# Patient Record
Sex: Female | Born: 1970
Health system: Southern US, Community
[De-identification: ages and names within clinical notes are randomized; demographics above are authoritative.]

## PROBLEM LIST (undated history)

## (undated) DIAGNOSIS — Z87440 Personal history of urinary (tract) infections: Secondary | ICD-10-CM

## (undated) DIAGNOSIS — B019 Varicella without complication: Secondary | ICD-10-CM

## (undated) DIAGNOSIS — M1990 Unspecified osteoarthritis, unspecified site: Secondary | ICD-10-CM

## (undated) DIAGNOSIS — E039 Hypothyroidism, unspecified: Secondary | ICD-10-CM

## (undated) DIAGNOSIS — T7840XA Allergy, unspecified, initial encounter: Secondary | ICD-10-CM

## (undated) HISTORY — DX: Allergy, unspecified, initial encounter: T78.40XA

## (undated) HISTORY — PX: NO PAST SURGERIES: SHX2092

## (undated) HISTORY — DX: Unspecified osteoarthritis, unspecified site: M19.90

## (undated) HISTORY — DX: Hypothyroidism, unspecified: E03.9

## (undated) HISTORY — DX: Varicella without complication: B01.9

## (undated) HISTORY — DX: Personal history of urinary (tract) infections: Z87.440

---

## 1998-01-30 ENCOUNTER — Other Ambulatory Visit: Admission: RE | Admit: 1998-01-30 | Discharge: 1998-01-30 | Payer: Self-pay | Admitting: *Deleted

## 1999-03-02 ENCOUNTER — Other Ambulatory Visit: Admission: RE | Admit: 1999-03-02 | Discharge: 1999-03-02 | Payer: Self-pay | Admitting: Obstetrics and Gynecology

## 1999-09-19 ENCOUNTER — Inpatient Hospital Stay (HOSPITAL_COMMUNITY): Admission: AD | Admit: 1999-09-19 | Discharge: 1999-09-21 | Payer: Self-pay | Admitting: Obstetrics and Gynecology

## 1999-11-02 ENCOUNTER — Other Ambulatory Visit: Admission: RE | Admit: 1999-11-02 | Discharge: 1999-11-02 | Payer: Self-pay | Admitting: Obstetrics and Gynecology

## 2000-12-07 ENCOUNTER — Other Ambulatory Visit: Admission: RE | Admit: 2000-12-07 | Discharge: 2000-12-07 | Payer: Self-pay | Admitting: Obstetrics and Gynecology

## 2001-10-15 ENCOUNTER — Other Ambulatory Visit: Admission: RE | Admit: 2001-10-15 | Discharge: 2001-10-15 | Payer: Self-pay | Admitting: Obstetrics and Gynecology

## 2002-05-06 ENCOUNTER — Inpatient Hospital Stay (HOSPITAL_COMMUNITY): Admission: AD | Admit: 2002-05-06 | Discharge: 2002-05-08 | Payer: Self-pay | Admitting: Obstetrics and Gynecology

## 2002-06-04 ENCOUNTER — Other Ambulatory Visit: Admission: RE | Admit: 2002-06-04 | Discharge: 2002-06-04 | Payer: Self-pay | Admitting: Obstetrics and Gynecology

## 2003-05-16 ENCOUNTER — Emergency Department (HOSPITAL_COMMUNITY): Admission: EM | Admit: 2003-05-16 | Discharge: 2003-05-16 | Payer: Self-pay | Admitting: Emergency Medicine

## 2003-05-28 ENCOUNTER — Emergency Department (HOSPITAL_COMMUNITY): Admission: EM | Admit: 2003-05-28 | Discharge: 2003-05-28 | Payer: Self-pay | Admitting: Emergency Medicine

## 2003-06-11 ENCOUNTER — Other Ambulatory Visit: Admission: RE | Admit: 2003-06-11 | Discharge: 2003-06-11 | Payer: Self-pay | Admitting: Obstetrics and Gynecology

## 2004-07-14 ENCOUNTER — Other Ambulatory Visit: Admission: RE | Admit: 2004-07-14 | Discharge: 2004-07-14 | Payer: Self-pay | Admitting: Obstetrics and Gynecology

## 2011-07-18 ENCOUNTER — Ambulatory Visit
Admission: RE | Admit: 2011-07-18 | Discharge: 2011-07-18 | Disposition: A | Discharge status: Home / Self Care | Payer: BC Managed Care – PPO | Source: Ambulatory Visit | Attending: Family Medicine | Admitting: Family Medicine

## 2011-07-18 ENCOUNTER — Other Ambulatory Visit: Payer: Self-pay | Admitting: Family Medicine

## 2011-07-18 DIAGNOSIS — M545 Low back pain, unspecified: Secondary | ICD-10-CM

## 2011-08-08 ENCOUNTER — Ambulatory Visit: Payer: BC Managed Care – PPO | Attending: Family Medicine

## 2011-08-08 DIAGNOSIS — IMO0001 Reserved for inherently not codable concepts without codable children: Secondary | ICD-10-CM | POA: Insufficient documentation

## 2011-08-08 DIAGNOSIS — M25659 Stiffness of unspecified hip, not elsewhere classified: Secondary | ICD-10-CM | POA: Insufficient documentation

## 2011-08-08 DIAGNOSIS — M545 Low back pain, unspecified: Secondary | ICD-10-CM | POA: Insufficient documentation

## 2011-08-12 ENCOUNTER — Ambulatory Visit: Payer: BC Managed Care – PPO | Admitting: Physical Therapy

## 2011-08-19 ENCOUNTER — Ambulatory Visit: Payer: BC Managed Care – PPO

## 2011-08-23 ENCOUNTER — Ambulatory Visit: Payer: BC Managed Care – PPO

## 2011-08-25 ENCOUNTER — Ambulatory Visit: Payer: BC Managed Care – PPO | Admitting: Physical Therapy

## 2011-08-29 ENCOUNTER — Ambulatory Visit: Payer: BC Managed Care – PPO | Attending: Family Medicine

## 2011-08-29 DIAGNOSIS — M25659 Stiffness of unspecified hip, not elsewhere classified: Secondary | ICD-10-CM | POA: Insufficient documentation

## 2011-08-29 DIAGNOSIS — M545 Low back pain, unspecified: Secondary | ICD-10-CM | POA: Insufficient documentation

## 2011-08-29 DIAGNOSIS — IMO0001 Reserved for inherently not codable concepts without codable children: Secondary | ICD-10-CM | POA: Insufficient documentation

## 2011-09-20 ENCOUNTER — Other Ambulatory Visit: Payer: Self-pay | Admitting: Family Medicine

## 2011-09-20 DIAGNOSIS — G8929 Other chronic pain: Secondary | ICD-10-CM

## 2011-09-20 DIAGNOSIS — M549 Dorsalgia, unspecified: Secondary | ICD-10-CM

## 2011-09-24 ENCOUNTER — Ambulatory Visit
Admission: RE | Admit: 2011-09-24 | Discharge: 2011-09-24 | Disposition: A | Discharge status: Home / Self Care | Payer: BC Managed Care – PPO | Source: Ambulatory Visit | Attending: Family Medicine | Admitting: Family Medicine

## 2011-09-24 DIAGNOSIS — G8929 Other chronic pain: Secondary | ICD-10-CM

## 2011-11-01 ENCOUNTER — Ambulatory Visit: Payer: BC Managed Care – PPO | Attending: Orthopedic Surgery

## 2011-11-01 DIAGNOSIS — M545 Low back pain, unspecified: Secondary | ICD-10-CM | POA: Insufficient documentation

## 2011-11-01 DIAGNOSIS — IMO0001 Reserved for inherently not codable concepts without codable children: Secondary | ICD-10-CM | POA: Insufficient documentation

## 2011-11-01 DIAGNOSIS — M546 Pain in thoracic spine: Secondary | ICD-10-CM | POA: Insufficient documentation

## 2011-11-01 DIAGNOSIS — R5381 Other malaise: Secondary | ICD-10-CM | POA: Insufficient documentation

## 2011-11-07 ENCOUNTER — Ambulatory Visit: Payer: BC Managed Care – PPO | Admitting: Physical Therapy

## 2011-11-09 ENCOUNTER — Ambulatory Visit: Payer: BC Managed Care – PPO | Admitting: Physical Therapy

## 2011-11-14 ENCOUNTER — Encounter: Payer: BC Managed Care – PPO | Admitting: Physical Therapy

## 2013-10-08 ENCOUNTER — Encounter: Payer: Self-pay | Admitting: *Deleted

## 2013-10-14 ENCOUNTER — Ambulatory Visit (INDEPENDENT_AMBULATORY_CARE_PROVIDER_SITE_OTHER): Payer: BC Managed Care – PPO | Admitting: Family Medicine

## 2013-10-14 ENCOUNTER — Encounter: Payer: Self-pay | Admitting: Family Medicine

## 2013-10-14 VITALS — BP 108/82 | HR 75 | Temp 97.5°F | Wt 125.0 lb

## 2013-10-14 DIAGNOSIS — J309 Allergic rhinitis, unspecified: Secondary | ICD-10-CM

## 2013-10-14 DIAGNOSIS — J302 Other seasonal allergic rhinitis: Secondary | ICD-10-CM

## 2013-10-14 DIAGNOSIS — Z7689 Persons encountering health services in other specified circumstances: Secondary | ICD-10-CM

## 2013-10-14 DIAGNOSIS — R946 Abnormal results of thyroid function studies: Secondary | ICD-10-CM

## 2013-10-14 DIAGNOSIS — Z7189 Other specified counseling: Secondary | ICD-10-CM

## 2013-10-14 NOTE — Progress Notes (Signed)
Pre visit review using our clinic review tool, if applicable. No additional management support is needed unless otherwise documented below in the visit note. 

## 2013-10-14 NOTE — Progress Notes (Signed)
No chief complaint on file.   HPI:  Melissa Johnson is here to establish care. Was unhappy with her prior pcp. She reports she is very sensitive to medications. Last PCP and physical:  Has the following chronic problems and concerns today:  There are no active problems to display for this patient.  Hypothyroidism: -diagnosed last year -reports she was put on synthroid and felt like did not tolerate this at all -reports the synthroid caused bloating and nausea for 1.5 weeks, hair loss worse rather then better on synthroid, acne -reports she stopped the synthroid in feb 2015 -feels better of the synthroid and really does not want to take it -denies: weight changes,  palpitations -she does have issues with constipation on and off -after being off of synthroid for 8 weeks she brings TSH which is 5/70 and free t4 at 0.55 on 09/16/13  Tongue swelling: -intermittently -she has read a lot that this is related to thyroid disorders  Chronic joint pain: -for several years -now gone after going gluten free for the last few months -does not exercise on a regular basis  Allergic Rhinitis: -used to get allergy shots -takes allegra and doing well  Tdap: she thinks she is UTD on vaccines   ROS negative for unless reported above: fevers, unintentional weight loss, hearing or vision loss, chest pain, palpitations, struggling to breath, hemoptysis, melena, hematochezia, hematuria, falls, loc, si, thoughts of self harm  Past Medical History  Diagnosis Date  . Hypothyroidism   . Chicken pox   . History of UTI   . Allergy   . Arthritis     OA, DDD    Family History  Problem Relation Age of Onset  . Heart disease Father     CAD  . Heart disease Paternal Grandmother     History   Social History  . Marital Status: Married    Spouse Name: N/A    Number of Children: N/A  . Years of Education: N/A   Social History Main Topics  . Smoking status: Former Research scientist (life sciences)  . Smokeless tobacco:  None     Comment: only in highschool and college  . Alcohol Use: Yes     Comment: 1 glass of wine here and there  . Drug Use: No  . Sexual Activity: None   Other Topics Concern  . None   Social History Narrative   Work or School: Teaches preschool - part-time      Home Situation: lives with husband and two kids (34 and 21 - 2015)      Spiritual Beliefs: Christian      Lifestyle: no regular exercise; diet is healthy and gluten free             Current outpatient prescriptions:fexofenadine (ALLEGRA) 180 MG tablet, Take 180 mg by mouth daily., Disp: , Rfl:   EXAM:  Filed Vitals:   10/14/13 1433  BP: 108/82  Pulse: 75  Temp: 97.5 F (36.4 C)    There is no height on file to calculate BMI.  GENERAL: vitals reviewed and listed above, alert, oriented, appears well hydrated and in no acute distress  HEENT: atraumatic, conjunttiva clear, no obvious abnormalities on inspection of external nose and ears  NECK: no obvious masses on inspection  LUNGS: clear to auscultation bilaterally, no wheezes, rales or rhonchi, good air movement  CV: HRRR, no peripheral edema  MS: moves all extremities without noticeable abnormality  PSYCH: pleasant and cooperative, no obvious depression or anxiety  ASSESSMENT  AND PLAN:  Discussed the following assessment and plan:  Borderline abnormal TFTs - Plan: Ambulatory referral to Endocrinology  Encounter to establish care  Seasonal allergies  -We reviewed the PMH, PSH, FH, SH, Meds and Allergies. -We provided refills for any medications we will prescribe as needed. -We addressed current concerns per orders and patient instructions. -We have asked for records for pertinent exams, studies, vaccines and notes from previous providers. -We have advised patient to follow up per instructions below.   -Patient advised to return or notify a doctor immediately if symptoms worsen or persist or new concerns arise.  Patient Instructions   -Vitamin D3 1000 IU daily ([facility] brand)  -stop the krill oil and multivitamin for now   We recommend the following healthy lifestyle measures: - eat a healthy diet consisting of lots of vegetables, fruits, beans, nuts, seeds, healthy meats such as white chicken and fish and whole grains.  - avoid fried foods, fast food, processed foods, sodas, red meet and other fattening foods.  - get a least 150 minutes of aerobic exercise per week.   Follow up in: 3 months      Abrham Maslowski R.

## 2013-10-14 NOTE — Patient Instructions (Signed)
-  Vitamin D3 1000 IU daily ([facility] brand)  -stop the krill oil and multivitamin for now   We recommend the following healthy lifestyle measures: - eat a healthy diet consisting of lots of vegetables, fruits, beans, nuts, seeds, healthy meats such as white chicken and fish and whole grains.  - avoid fried foods, fast food, processed foods, sodas, red meet and other fattening foods.  - get a least 150 minutes of aerobic exercise per week.   Follow up in: 3 months

## 2013-10-29 ENCOUNTER — Encounter: Payer: Self-pay | Admitting: Internal Medicine

## 2013-10-29 ENCOUNTER — Ambulatory Visit (INDEPENDENT_AMBULATORY_CARE_PROVIDER_SITE_OTHER): Payer: BC Managed Care – PPO | Admitting: Internal Medicine

## 2013-10-29 VITALS — BP 100/68 | HR 83 | Temp 98.2°F | Resp 12 | Ht 62.25 in | Wt 122.6 lb

## 2013-10-29 DIAGNOSIS — E039 Hypothyroidism, unspecified: Secondary | ICD-10-CM | POA: Insufficient documentation

## 2013-10-29 NOTE — Patient Instructions (Signed)
Please stop at the lab. Please try to join MyChart for easier communication. Please come back for a follow-up appointment in 3 months.

## 2013-10-29 NOTE — Progress Notes (Signed)
Patient ID: Melissa Johnson, female   DOB: 02-15-1971, 43 y.o.   MRN: [id number]   HPI  Melissa Johnson is a 43 y.o.-year-old female, referred by her PCP, Dr. Maudie Mercury, in consultation for ? Need for thyroid hormone supplementation (previously had intolerance) in in the setting of mild hypothyroidism.  Pt. has been dx with hypothyroidism in 11/2012 (had hair loss and brittle nails); she started Synthroid DAW then >> felt worse (more hair loss, fatigue) and could not tolerate it: bloating, nausea >> stopped 04/2013. She would not want to restart it. 8 weeks after she stopped the LT4 >> labs: 09/16/2013: TSH 5.7 (0.34-4.5), free t4 0.55 (0.61-1.12)  I reviewed pt's previous thyroid tests: 04/29/2013: TSH 1.83, free t4 0.65 01/18/2013: TSH 1.46 11/2012: TSH 6  Pt describes no : - resolved fatigue - no weight gain or loss - no cold intolerance - no constipation - no dry skin - resolved hair falling - no depression  Pt denies feeling nodules in neck, hoarseness, dysphagia/odynophagia, SOB with lying down.  She has + FH of thyroid disorders in: sister (hypothyroid), mother (hyperthyroidism) - none on meds. No FH of thyroid cancer.  No h/o radiation tx to head or neck. No use of iodine supplements.  ROS: Constitutional: see HPI Eyes: no blurry vision, no xerophthalmia ENT: no sore throat, no nodules palpated in throat, no dysphagia/odynophagia, no hoarseness Cardiovascular: no CP/SOB/palpitations/leg swelling Respiratory: no cough/SOB Gastrointestinal: no N/V/D/C Musculoskeletal: + muscle/+ joint aches Skin: no rashes Neurological: no tremors/numbness/tingling/dizziness Psychiatric: no depression/+ anxiety  Past Medical History  Diagnosis Date  . Hypothyroidism   . Chicken pox   . History of UTI   . Allergy   . Arthritis     OA, DDD   No past surgical history on file.  History   Social History Main Topics  . Smoking status: Former Research scientist (life sciences)  . Smokeless tobacco: Not on file      Comment: only in highschool and college  . Alcohol Use: Yes     Comment: 1 glass of wine once-twice a week  . 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 fexofenadine (ALLEGRA) 180 MG tablet Take 180 mg by mouth daily.       No current facility-administered medications on file prior to visit.   No Known Allergies Family History  Problem Relation Age of Onset  . Heart disease Father     CAD  . Heart disease Paternal Grandmother    PE: BP 100/68  Pulse 83  Temp(Src) 98.2 F (36.8 C) (Oral)  Resp 12  Ht 5' 2.25" (1.581 m)  Wt 122 lb 9.6 oz (55.611 kg)  BMI 22.25 kg/m2  SpO2 98% Wt Readings from Last 3 Encounters:  10/29/13 122 lb 9.6 oz (55.611 kg)  10/14/13 125 lb (56.7 kg)   Constitutional: normal weight, in NAD Eyes: PERRLA, EOMI, no exophthalmos ENT: moist mucous membranes, no thyromegaly, no cervical lymphadenopathy Cardiovascular: RRR, No MRG Respiratory: CTA B Gastrointestinal: abdomen soft, NT, ND, BS+ Musculoskeletal: no deformities, strength intact in all 4 Skin: moist, warm, no rashes Neurological: no tremor with outstretched hands,  DTR normal in all 4  ASSESSMENT: 1. Hypothyroidism - mild  PLAN:  1. Patient with relatively new dx of hypothyroidism, not on levothyroxine therapy. She appears euthyroid. She does not appear to have a goiter, thyroid nodules, or neck compression symptoms. - she tried Synthroid DAW and could not tolerate it - would like to know about alternatives. We discussed that her hypothyroidism is mild, and the fact that we can continue to observe it w/o tx if remains mild.  - we discussed about options:  Use Levothyroxine  generic  Use the 50 mcg Synthroid tablet as this does not contain dyes and other excipients (pt agrees with this)  Using Levoxyl  Adding Cytomel to Synthroid ot Levoxyl  Using Armour We discussed about positive and negative aspects of using Armour thyroid. I underlined the fact that:  Armour is purified from porcine thyroid glands, which is not without risk for contaminants  Also, the ratio between T4 and T3 in Armour is physiologic for pigs, not for humans.  The short half life of T3 can cause fluctuations in blood levels, which can result in mood swings and heart rhythm abnormalities.  The concentration of the active substances (T4 and T3) can be expected to vary between different Armour lots, which can cause variation in the thyroid function tests.  - we will check thyroid tests today: Orders Placed This Encounter  Procedures  . TSH  . T4, free  . T3, free  . Thyroid Peroxidase Antibody  . Thyroglobulin antibody  - If these are abnormal, we may need to start Synthroid 25 mcg (1/2 tab of 50 mcg)  And she will need to return in 6-8 weeks for repeat labs - If these are normal, I will see her back in 4 months   Office Visit on 10/29/2013  Component Date Value Ref Range Status  . Thyroid Peroxidase Antibody 10/29/2013 437.0* <35.0 IU/mL Final   Comment:                            The thyroid microsomal antigen has been shown to be Thyroid                          Peroxidase (TPO).  This assay detects anti-TPO antibodies.  . Thyroglobulin Ab 10/29/2013 <20.0  <40.0 IU/mL Final  . TSH 10/30/2013 0.81  0.35 - 4.50 uIU/mL Final  . Free T4 10/30/2013 0.57* 0.60 - 1.60 ng/dL Final  . T3, Free 10/30/2013 2.2* 2.3 - 4.2 pg/mL Final   New dx of Hashimoto's thyroiditis. TSH normal, with low fT4 and fT3. Continue to monitor for now. No need for thyroid hh supplementation since fT4 and fT3 slightly low, only, especially since she is reticent to start them. Will recheck the tests in 2 months.

## 2013-10-30 ENCOUNTER — Other Ambulatory Visit: Payer: Self-pay | Admitting: *Deleted

## 2013-10-30 DIAGNOSIS — E039 Hypothyroidism, unspecified: Secondary | ICD-10-CM

## 2013-10-30 LAB — TSH: TSH: 0.81 u[IU]/mL (ref 0.35–4.50)

## 2013-10-30 LAB — T4, FREE: Free T4: 0.57 ng/dL — ABNORMAL LOW (ref 0.60–1.60)

## 2013-10-30 LAB — T3, FREE: T3, Free: 2.2 pg/mL — ABNORMAL LOW (ref 2.3–4.2)

## 2013-10-31 LAB — THYROGLOBULIN ANTIBODY: Thyroglobulin Ab: <20 IU/mL (ref <40.0)

## 2013-10-31 LAB — THYROID PEROXIDASE ANTIBODY: Thyroperoxidase Ab SerPl-aCnc: 437 IU/mL — ABNORMAL HIGH (ref <35.0)

## 2013-11-01 ENCOUNTER — Telehealth: Payer: Self-pay | Admitting: *Deleted

## 2013-11-01 NOTE — Telephone Encounter (Signed)
Called pt and advised her per Dr Cruzita Lederer; New dx of Hashimoto's thyroiditis (she has positive antibodies against her thyroid - we discussed about this when she was here). TSH normal noW, with slightly low fT4 and fT3. Will continue to monitor for now. No need for thyroid hh supplementation since fT4 and fT3 slightly low, only. Will need to recheck the tests in 2 months. Pt pleased. Scheduled lab appt for Oct 6th. Be advised.

## 2013-11-15 ENCOUNTER — Telehealth: Payer: Self-pay | Admitting: Family Medicine

## 2013-11-15 MED ORDER — FLUTICASONE PROPIONATE 50 MCG/ACT NA SUSP: NASAL | Status: DC

## 2013-11-15 NOTE — Telephone Encounter (Signed)
Pt states fluticasone helps with her allergies and would like to know if Dr. Maudie Mercury will [facility] in [facility] [place].

## 2013-11-15 NOTE — Telephone Encounter (Signed)
I called the pt and informed her the Rx was sent to her pharmacy.

## 2013-11-15 NOTE — Telephone Encounter (Signed)
Sure - please send flonase, 2 sprays each nostril dialy for 1 month, then 1 spray each nostril, 16g, 1 refill

## 2013-12-31 ENCOUNTER — Other Ambulatory Visit (INDEPENDENT_AMBULATORY_CARE_PROVIDER_SITE_OTHER): Payer: BC Managed Care – PPO

## 2013-12-31 DIAGNOSIS — E039 Hypothyroidism, unspecified: Secondary | ICD-10-CM

## 2014-01-01 LAB — T4, FREE: FREE T4: 0.6 ng/dL (ref 0.60–1.60)

## 2014-01-01 LAB — TSH: TSH: 5.82 u[IU]/mL — AB (ref 0.35–4.50)

## 2014-01-01 LAB — T3, FREE: T3 FREE: 2.2 pg/mL — AB (ref 2.3–4.2)

## 2014-01-29 ENCOUNTER — Encounter: Payer: Self-pay | Admitting: Internal Medicine

## 2014-01-29 ENCOUNTER — Other Ambulatory Visit: Payer: Self-pay | Admitting: *Deleted

## 2014-01-29 ENCOUNTER — Ambulatory Visit (INDEPENDENT_AMBULATORY_CARE_PROVIDER_SITE_OTHER): Payer: BC Managed Care – PPO | Admitting: Internal Medicine

## 2014-01-29 VITALS — BP 102/60 | HR 90 | Temp 98.6°F | Resp 12 | Wt 122.0 lb

## 2014-01-29 DIAGNOSIS — Z23 Encounter for immunization: Secondary | ICD-10-CM

## 2014-01-29 DIAGNOSIS — E063 Autoimmune thyroiditis: Secondary | ICD-10-CM | POA: Insufficient documentation

## 2014-01-29 NOTE — Patient Instructions (Signed)
Euthyroid/mildly hypothyroid Hashimoto's thyroiditis.  Please stop at the lab.

## 2014-01-29 NOTE — Progress Notes (Signed)
Patient ID: Melissa Johnson, female   DOB: [birth year], 43 y.o.   MRN: [id number]   HPI  Melissa Johnson is a 43 y.o.-year-old female, returning for f/u for Hashimoto's thyroiditis. Last visit 3 mo ago.  Reviewed hx: Pt. has been dx with hypothyroidism in 11/2012 (had hair loss and brittle nails); she started Synthroid DAW then >> felt worse (more hair loss, fatigue) and could not tolerate it: bloating, nausea >> stopped 04/2013. She would not want to restart it. 8 weeks after she stopped the LT4 >> labs: 09/16/2013: TSH 5.7 (0.34-4.5), free t4 0.55 (0.61-1.12)  I reviewed pt's previous thyroid tests Component     Latest Ref Rng 10/30/2013 12/31/2013  TSH     0.35 - 4.50 uIU/mL 0.81 5.82 (H)  Free T4     0.60 - 1.60 ng/dL 0.57 (L) 0.60  T3, Free     2.3 - 4.2 pg/mL 2.2 (L) 2.2 (L)    Component     Latest Ref Rng 10/29/2013  Thyroid Peroxidase Antibody     <35.0 IU/mL 437.0 (H)  Thyroglobulin Ab     <40.0 IU/mL <20.0   Previously: 09/16/2013: TSH 5.7 (0.34-4.5), free t4 0.55 (0.61-1.12) 04/29/2013: TSH 1.83, free t4 0.65 01/18/2013: TSH 1.46 11/2012: TSH 6  She is not on LT4 - preferred not to start as asymptomatic.  Pt describes: - + fatigue - no weight gain or loss, + increased appetite - no cold intolerance - no constipation - no dry skin - resolved hair falling - no depression + more looser stools  Pt denies feeling nodules in neck, hoarseness, dysphagia/odynophagia, SOB with lying down.   ROS: Constitutional: see HPI Eyes: no blurry vision, no xerophthalmia ENT: no sore throat, no nodules palpated in throat, no dysphagia/odynophagia, no hoarseness Cardiovascular: no CP/SOB/palpitations/leg swelling Respiratory: no cough/SOB Gastrointestinal: no N/V/D/C, + looser stools Musculoskeletal:no muscle/joint aches Skin: no rashes Neurological: no tremors/numbness/tingling/dizziness  I reviewed pt's medications, allergies, PMH, social hx, family hx and no changes required,  except as mentioned above.  PE: BP 102/60 mmHg  Pulse 90  Temp(Src) 98.6 F (37 C) (Oral)  Resp 12  Wt 122 lb (55.339 kg)  SpO2 98% Wt Readings from Last 3 Encounters:  01/29/14 122 lb (55.339 kg)  10/29/13 122 lb 9.6 oz (55.611 kg)  10/14/13 125 lb (56.7 kg)   Constitutional: normal weight, in NAD Eyes: PERRLA, EOMI, no exophthalmos ENT: moist mucous membranes, no thyromegaly, no cervical lymphadenopathy Cardiovascular: RRR, No MRG Respiratory: CTA B Gastrointestinal: abdomen soft, NT, ND, BS+ Musculoskeletal: no deformities, strength intact in all 4 Skin: moist, warm, no rashes Neurological: no tremor with outstretched hands, DTR normal in all 4  ASSESSMENT: 1. Hypothyroidism - mild - Hashimoto's thyroiditis - dx 10/2013 - tried LT4 (Synthroid) >> acne, bloating  PLAN:  1. Patient with mild Hashimoto's hypothyroidism (first visit after dx od autoimmunity), not on levothyroxine therapy. She appears euthyroid. She does not appear to have a goiter, thyroid nodules, or neck compression symptoms. - she tried Synthroid DAW and could not tolerate it >> bloating, acne. We again discussed that her hypothyroidism is mild, and the fact that we can continue to observe it w/o tx if remains mild.  - we discussed about options:  Use Levothyroxine generic  Use the 50 mcg Synthroid tablet as this does not contain dyes and other excipients (pt agrees with this)  Using Levoxyl or Tirosint  Adding Cytomel to Synthroid ot Levoxyl  Using Armour - we will check thyroid  tests today - If these are abnormal, we may need to start Synthroid 25 mcg (1/2 tab of 50 mcg)  And she will need to return in 6-8 weeks for repeat labs - If these are normal, I will see her back in 1 year, but repeat the tests in 6 mo - will give flu vaccine today at her request  - time spent with the patient: 25 min, of which >50% was spent in reviewing her thyroid labs, the importance of the thyroid Ab's, discussing  prognosis and usual evolution of the Hashimoto's thyroiditis, means to improve her immune system, and also possible modalities for tx. She had several Q's that I answered.  Office Visit on 01/29/2014  Component Date Value Ref Range Status  . TSH 01/29/2014 6.17* 0.35 - 4.50 uIU/mL Final  . Free T4 01/29/2014 0.68  0.60 - 1.60 ng/dL Final  . T3, Free 01/29/2014 2.7  2.3 - 4.2 pg/mL Final   TSH a little high, but <10 and with normal free T4 and free T3. Okay to continue off thyroid hormones for now and we will recheck in 6 months as planned.

## 2014-01-31 LAB — T4, FREE: FREE T4: 0.68 ng/dL (ref 0.60–1.60)

## 2014-01-31 LAB — TSH: TSH: 6.17 u[IU]/mL — ABNORMAL HIGH (ref 0.35–4.50)

## 2014-01-31 LAB — T3, FREE: T3 FREE: 2.7 pg/mL (ref 2.3–4.2)

## 2014-03-06 ENCOUNTER — Ambulatory Visit (INDEPENDENT_AMBULATORY_CARE_PROVIDER_SITE_OTHER): Payer: BC Managed Care – PPO | Admitting: Family Medicine

## 2014-03-06 ENCOUNTER — Telehealth: Payer: Self-pay | Admitting: Family Medicine

## 2014-03-06 ENCOUNTER — Encounter: Payer: Self-pay | Admitting: Family Medicine

## 2014-03-06 VITALS — BP 100/70 | HR 84 | Temp 98.2°F | Ht 62.25 in | Wt 123.5 lb

## 2014-03-06 DIAGNOSIS — N390 Urinary tract infection, site not specified: Secondary | ICD-10-CM

## 2014-03-06 DIAGNOSIS — R300 Dysuria: Secondary | ICD-10-CM

## 2014-03-06 LAB — POCT URINALYSIS DIPSTICK
Bilirubin, UA: NEGATIVE
Glucose, UA: NEGATIVE
Ketones, UA: NEGATIVE
NITRITE UA: NEGATIVE
PH UA: 6.5
Protein, UA: NEGATIVE
SPEC GRAV UA: 1.02
UROBILINOGEN UA: 0.2

## 2014-03-06 MED ORDER — CIPROFLOXACIN HCL 250 MG PO TABS: 250.0000 mg | ORAL_TABLET | Freq: Two times a day (BID) | ORAL | Status: DC

## 2014-03-06 NOTE — Telephone Encounter (Signed)
If just UTI ok to double book at 3:15 pm - let her know we will be working her in around the other patients and could be a wait if she is ok with this, so bring a book or something to do. Thanks.

## 2014-03-06 NOTE — Progress Notes (Signed)
  HPI:  Dysuria: -work in for dysuria -started about 1 week ago -symptoms: dysuria, urgency, urine odor, pressure in bladder with urination -has been drinking extra water -denies:fevers, NVD, flank pain, vaginal symptoms -hx of UTI and reports this feel like that -FDLMP: Nov 29th, 2015  ROS: See pertinent positives and negatives per HPI.  Past Medical History  Diagnosis Date  . Hypothyroidism   . Chicken pox   . History of UTI   . Allergy   . Arthritis     OA, DDD    No past surgical history on file.  Family History  Problem Relation Age of Onset  . Heart disease Father     CAD  . Heart disease Paternal Grandmother     History   Social History  . Marital Status: Married    Spouse Name: N/A    Number of Children: N/A  . Years of Education: N/A   Social History Main Topics  . Smoking status: Former Research scientist (life sciences)  . Smokeless tobacco: None     Comment: only in highschool and college  . Alcohol Use: Yes     Comment: 1 glass of wine here and there  . Drug Use: No  . Sexual Activity: None   Other Topics Concern  . None   Social History Narrative   Work or School: Teaches preschool - part-time      Home Situation: lives with husband and two kids (81 and 33 - 2015)      Spiritual Beliefs: Christian      Lifestyle: no regular exercise; diet is healthy and gluten free             Current outpatient prescriptions: fexofenadine (ALLEGRA) 180 MG tablet, Take 180 mg by mouth daily., Disp: , Rfl: ;  fluticasone (FLONASE) 50 MCG/ACT nasal spray, 2 sprays each nostril daily for 1 month, then 1 spray each nostril daily, Disp: 16 g, Rfl: 1;  VITAMIN D, CHOLECALCIFEROL, PO, Take 1 capsule by mouth daily., Disp: , Rfl:  ciprofloxacin (CIPRO) 250 MG tablet, Take 1 tablet (250 mg total) by mouth 2 (two) times daily., Disp: 6 tablet, Rfl: 0  EXAM:  Filed Vitals:   03/06/14 1532  BP: 100/70  Pulse: 84  Temp: 98.2 F (36.8 C)    Body mass index is 22.41  kg/(m^2).  GENERAL: vitals reviewed and listed above, alert, oriented, appears well hydrated and in no acute distress  HEENT: atraumatic, conjunttiva clear, no obvious abnormalities on inspection of external nose and ears  NECK: no obvious masses on inspection  LUNGS: clear to auscultation bilaterally, no wheezes, rales or rhonchi, good air movement  CV: HRRR, no peripheral edema  ABD: soft, NTTP, no CVA TTP  MS: moves all extremities without noticeable abnormality  PSYCH: pleasant and cooperative, no obvious depression or anxiety  ASSESSMENT AND PLAN:  Discussed the following assessment and plan:  Dysuria - Plan: POC Urinalysis Dipstick, ciprofloxacin (CIPRO) 250 MG tablet  UTI (lower urinary tract infection) - Plan: ciprofloxacin (CIPRO) 250 MG tablet  -pus and blood on urine -tx with cipro, culture pending -Patient advised to return or notify a doctor immediately if symptoms worsen or persist or new concerns arise.  There are no Patient Instructions on file for this visit.   Colin Benton R.

## 2014-03-06 NOTE — Progress Notes (Signed)
Pre visit review using our clinic review tool, if applicable. No additional management support is needed unless otherwise documented below in the visit note. 

## 2014-03-06 NOTE — Addendum Note (Signed)
Addended by: Agnes Lawrence on: 03/06/2014 03:49 PM   Modules accepted: Orders

## 2014-03-06 NOTE — Telephone Encounter (Signed)
I called the pt and she stated she will come in today at 3:15pm. Melissa Johnson scheduled appt.

## 2014-03-06 NOTE — Telephone Encounter (Signed)
Pt would like an appt today ?uti. Can I create slot today?

## 2014-03-09 LAB — URINE CULTURE: Colony Count: >=100000

## 2014-04-03 ENCOUNTER — Telehealth: Payer: Self-pay | Admitting: Family Medicine

## 2014-04-03 MED ORDER — SCOPOLAMINE 1 MG/3DAYS TD PT72: 1.0000 | MEDICATED_PATCH | TRANSDERMAL | Status: DC

## 2014-04-03 NOTE — Telephone Encounter (Signed)
Patient informed. 

## 2014-04-03 NOTE — Telephone Encounter (Signed)
Pt said she is going on a cruise  and is requesting something for motion sickness. She is requesting the patch      Pharmacy  [facility]

## 2014-04-03 NOTE — Telephone Encounter (Signed)
Sent enough for 9 days. If cruise longer then 9 days let us know.

## 2014-10-06 ENCOUNTER — Ambulatory Visit (INDEPENDENT_AMBULATORY_CARE_PROVIDER_SITE_OTHER): Payer: BLUE CROSS/BLUE SHIELD | Admitting: Family Medicine

## 2014-10-06 ENCOUNTER — Encounter: Payer: Self-pay | Admitting: Family Medicine

## 2014-10-06 VITALS — BP 118/70 | HR 79 | Temp 98.2°F | Ht 62.25 in | Wt 126.1 lb

## 2014-10-06 DIAGNOSIS — R300 Dysuria: Secondary | ICD-10-CM

## 2014-10-06 LAB — POCT URINALYSIS DIPSTICK
Bilirubin, UA: NEGATIVE
GLUCOSE UA: NEGATIVE
Ketones, UA: NEGATIVE
NITRITE UA: NEGATIVE
Protein, UA: NEGATIVE
SPEC GRAV UA: 1.015
Urobilinogen, UA: 0.2
pH, UA: 7.5

## 2014-10-06 MED ORDER — NITROFURANTOIN MONOHYD MACRO 100 MG PO CAPS: 100.0000 mg | ORAL_CAPSULE | Freq: Two times a day (BID) | ORAL | Status: DC

## 2014-10-06 NOTE — Progress Notes (Signed)
Pre visit review using our clinic review tool, if applicable. No additional management support is needed unless otherwise documented below in the visit note. 

## 2014-10-06 NOTE — Addendum Note (Signed)
Addended by: Agnes Lawrence on: 10/06/2014 01:44 PM   Modules accepted: Orders

## 2014-10-06 NOTE — Progress Notes (Signed)
  HPI:  Acute visit for:  Dysuria: -started 2 days ago -symptoms: dysuria, frequency, urgency, pressure -denies: hematuria, nausea, vomiting, diarrhea, fevers, flank pain -FDLMP: has heavy long period last month, thinks getting ready to start period -she has just seen her gynecologist, Dr. Matthew Saras  ROS: See pertinent positives and negatives per HPI.  Past Medical History  Diagnosis Date  . Hypothyroidism   . Chicken pox   . History of UTI   . Allergy   . Arthritis     OA, DDD    No past surgical history on file.  Family History  Problem Relation Age of Onset  . Heart disease Father     CAD  . Heart disease Paternal Grandmother     History   Social History  . Marital Status: Married    Spouse Name: N/A  . Number of Children: N/A  . Years of Education: N/A   Social History Main Topics  . Smoking status: Former Research scientist (life sciences)  . Smokeless tobacco: Not on file     Comment: only in highschool and college  . Alcohol Use: Yes     Comment: 1 glass of wine here and there  . Drug Use: No  . Sexual Activity: Not on file   Other Topics Concern  . None   Social History Narrative   Work or School: Teaches preschool - part-time      Home Situation: lives with husband and two kids (17 and 13 - 2015)      Spiritual Beliefs: Christian      Lifestyle: no regular exercise; diet is healthy and gluten free              Current outpatient prescriptions:  .  fexofenadine (ALLEGRA) 180 MG tablet, Take 180 mg by mouth daily., Disp: , Rfl:  .  fluticasone (FLONASE) 50 MCG/ACT nasal spray, 2 sprays each nostril daily for 1 month, then 1 spray each nostril daily, Disp: 16 g, Rfl: 1 .  VITAMIN D, CHOLECALCIFEROL, PO, Take 1 capsule by mouth daily., Disp: , Rfl:  .  nitrofurantoin, macrocrystal-monohydrate, (MACROBID) 100 MG capsule, Take 1 capsule (100 mg total) by mouth 2 (two) times daily., Disp: 14 capsule, Rfl: 0  EXAM:  Filed Vitals:   10/06/14 0939  BP: 118/70  Pulse: 79   Temp: 98.2 F (36.8 C)    Body mass index is 22.88 kg/(m^2).  GENERAL: vitals reviewed and listed above, alert, oriented, appears well hydrated and in no acute distress  HEENT: atraumatic, conjunttiva clear, no obvious abnormalities on inspection of external nose and ears  NECK: no obvious masses on inspection  LUNGS: clear to auscultation bilaterally, no wheezes, rales or rhonchi, good air movement  CV: HRRR, no peripheral edema  ABD: BS+, soft, NTTP  MS: moves all extremities without noticeable abnormality  PSYCH: pleasant and cooperative, no obvious depression or anxiety  ASSESSMENT AND PLAN:  Discussed the following assessment and plan:  Dysuria - Plan: Culture, Urine, POC Urinalysis Dipstick, nitrofurantoin, macrocrystal-monohydrate, (MACROBID) 100 MG capsule  -leuks and bld; she is sure this is a UTI -culture pending, empiric abx per her per her wishes -she was advised to follow up with gyn about her irr periods -Patient advised to return or notify a doctor immediately if symptoms worsen or persist or new concerns arise.  There are no Patient Instructions on file for this visit.   Colin Benton R.

## 2014-10-09 LAB — URINE CULTURE: Colony Count: >=100000

## 2014-12-22 ENCOUNTER — Encounter: Payer: Self-pay | Admitting: Family Medicine

## 2014-12-22 ENCOUNTER — Ambulatory Visit (INDEPENDENT_AMBULATORY_CARE_PROVIDER_SITE_OTHER): Payer: BLUE CROSS/BLUE SHIELD | Admitting: Family Medicine

## 2014-12-22 VITALS — BP 102/70 | HR 86 | Temp 98.0°F | Ht 62.25 in | Wt 128.3 lb

## 2014-12-22 DIAGNOSIS — Z23 Encounter for immunization: Secondary | ICD-10-CM

## 2014-12-22 DIAGNOSIS — R300 Dysuria: Secondary | ICD-10-CM | POA: Diagnosis not present

## 2014-12-22 LAB — POCT URINALYSIS DIPSTICK
BILIRUBIN UA: NEGATIVE
Glucose, UA: NEGATIVE
KETONES UA: NEGATIVE
NITRITE UA: NEGATIVE
Protein, UA: NEGATIVE
Spec Grav, UA: 1.01
Urobilinogen, UA: 0.2
pH, UA: 6

## 2014-12-22 MED ORDER — NITROFURANTOIN MONOHYD MACRO 100 MG PO CAPS: 100.0000 mg | ORAL_CAPSULE | Freq: Two times a day (BID) | ORAL | Status: DC

## 2014-12-22 NOTE — Progress Notes (Signed)
  HPI:  Dysuria: -hx frequent utis as a child and recently -frequency, urgency and dysuria for 3 days -denies: flank pain, chills, nausea, vomiting, hematuria, vag symptoms -FDLMP sept 16th 2016 -these tend to occur after sex  ROS: See pertinent positives and negatives per HPI.  Past Medical History  Diagnosis Date  . Hypothyroidism   . Chicken pox   . History of UTI   . Allergy   . Arthritis     OA, DDD    No past surgical history on file.  Family History  Problem Relation Age of Onset  . Heart disease Father     CAD  . Heart disease Paternal Grandmother     Social History   Social History  . Marital Status: Married    Spouse Name: N/A  . Number of Children: N/A  . Years of Education: N/A   Social History Main Topics  . Smoking status: Former Research scientist (life sciences)  . Smokeless tobacco: None     Comment: only in highschool and college  . Alcohol Use: Yes     Comment: 1 glass of wine here and there  . Drug Use: No  . Sexual Activity: Not Asked   Other Topics Concern  . None   Social History Narrative   Work or School: Teaches preschool - part-time      Home Situation: lives with husband and two kids (40 and 48 - 2015)      Spiritual Beliefs: Christian      Lifestyle: no regular exercise; diet is healthy and gluten free              Current outpatient prescriptions:  .  fexofenadine (ALLEGRA) 180 MG tablet, Take 180 mg by mouth daily., Disp: , Rfl:  .  fluticasone (FLONASE) 50 MCG/ACT nasal spray, 2 sprays each nostril daily for 1 month, then 1 spray each nostril daily, Disp: 16 g, Rfl: 1 .  VITAMIN D, CHOLECALCIFEROL, PO, Take 1 capsule by mouth daily., Disp: , Rfl:  .  nitrofurantoin, macrocrystal-monohydrate, (MACROBID) 100 MG capsule, Take 1 capsule (100 mg total) by mouth 2 (two) times daily., Disp: 14 capsule, Rfl: 0  EXAM:  Filed Vitals:   12/22/14 0926  BP: 102/70  Pulse: 86  Temp: 98 F (36.7 C)    Body mass index is 23.28 kg/(m^2).  GENERAL:  vitals reviewed and listed above, alert, oriented, appears well hydrated and in no acute distress  HEENT: atraumatic, conjunttiva clear, no obvious abnormalities on inspection of external nose and ears  NECK: no obvious masses on inspection  LUNGS: clear to auscultation bilaterally, no wheezes, rales or rhonchi, good air movement  CV: HRRR, no peripheral edema  MS: moves all extremities without noticeable abnormality  PSYCH: pleasant and cooperative, no obvious depression or anxiety  ASSESSMENT AND PLAN:  Discussed the following assessment and plan:  Dysuria - Plan: POC Urinalysis Dipstick, Culture, Urine  -udip with leuks and bld, she opted for empiric abx -discussed prophylactic measures: urinating after sex, lubricant, lubricant 2x weekly for vaginal dryness, cran/blueberry pure juice -may consider referral to urology if frequent recurrence -culture pending to confirm -Patient advised to return or notify a doctor immediately if symptoms worsen or persist or new concerns arise.  There are no Patient Instructions on file for this visit.   Colin Benton R.

## 2014-12-22 NOTE — Progress Notes (Signed)
Pre visit review using our clinic review tool, if applicable. No additional management support is needed unless otherwise documented below in the visit note. 

## 2014-12-24 LAB — URINE CULTURE

## 2015-01-30 ENCOUNTER — Encounter: Payer: Self-pay | Admitting: Internal Medicine

## 2015-01-30 ENCOUNTER — Ambulatory Visit (INDEPENDENT_AMBULATORY_CARE_PROVIDER_SITE_OTHER): Payer: BLUE CROSS/BLUE SHIELD | Admitting: Internal Medicine

## 2015-01-30 VITALS — BP 108/64 | HR 77 | Temp 98.3°F | Resp 12 | Wt 123.0 lb

## 2015-01-30 DIAGNOSIS — E063 Autoimmune thyroiditis: Secondary | ICD-10-CM | POA: Diagnosis not present

## 2015-01-30 LAB — T3, FREE: T3, Free: 2.3 pg/mL (ref 2.3–4.2)

## 2015-01-30 LAB — TSH: TSH: 5.35 u[IU]/mL — ABNORMAL HIGH (ref 0.35–4.50)

## 2015-01-30 LAB — T4, FREE: FREE T4: 0.61 ng/dL (ref 0.60–1.60)

## 2015-01-30 NOTE — Progress Notes (Signed)
Patient ID: Melissa Johnson, female   DOB: 1970-07-18, 44 y.o.   MRN: [id number]   HPI  Melissa Johnson is a 44 y.o.-year-old female, returning for f/u for Hashimoto's thyroiditis. Last visit 1 year ago.  Reviewed hx: Pt. has been dx with hypothyroidism in 11/2012 (had hair loss and brittle nails); she started Synthroid DAW then >> felt worse (more hair loss, fatigue) and could not tolerate it: bloating, nausea >> stopped 04/2013. She would not want to restart it.   8 weeks after she stopped the LT4 >> labs: 09/16/2013: TSH 5.7 (0.34-4.5), free t4 0.55 (0.61-1.12)  I reviewed pt's previous thyroid tests: Component     Latest Ref Rng 10/30/2013 12/31/2013 01/29/2014  TSH     0.35 - 4.50 uIU/mL 0.81 5.82 (H) 6.17 (H)  Free T4     0.60 - 1.60 ng/dL 0.57 (L) 0.60 0.68  T3, Free     2.3 - 4.2 pg/mL 2.2 (L) 2.2 (L) 2.7   Thyroid Ab's were positive:  Component     Latest Ref Rng 10/29/2013  Thyroid Peroxidase Antibody     <35.0 IU/mL 437.0 (H)  Thyroglobulin Ab     <40.0 IU/mL <20.0   Previously: 09/16/2013: TSH 5.7 (0.34-4.5), free t4 0.55 (0.61-1.12) 04/29/2013: TSH 1.83, free t4 0.65 01/18/2013: TSH 1.46 11/2012: TSH 6  She is not on LT4 - preferred not to start as asymptomatic.  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   ROS: Constitutional: see HPI Eyes: no blurry vision, no xerophthalmia ENT: no sore throat, no nodules palpated in throat, no dysphagia/odynophagia, no hoarseness Cardiovascular: no  CP/SOB/palpitations/leg swelling Respiratory: no cough/SOB Gastrointestinal: no N/V/D/C, + looser stools Musculoskeletal:no muscle/+ joint aches Skin: no rashes Neurological: no tremors/numbness/tingling/dizziness  I reviewed pt's medications, allergies, PMH, social hx, family hx, and changes were documented in the history of present illness. Otherwise, unchanged from my initial visit note.  PE: BP 108/64 mmHg  Pulse 77  Temp(Src) 98.3 F (36.8 C) (Oral)  Resp 12  Wt 123 lb (55.792 kg)  SpO2 98% Wt Readings from Last 3 Encounters:  01/30/15 123 lb (55.792 kg)  12/22/14 128 lb 4.8 oz (58.196 kg)  10/06/14 126 lb 1.6 oz (57.199 kg)   Constitutional: normal weight, in NAD Eyes: PERRLA, EOMI, no exophthalmos ENT: moist mucous membranes, no thyromegaly, no cervical lymphadenopathy Cardiovascular: RRR, No MRG Respiratory: CTA B Gastrointestinal: abdomen soft, NT, ND, BS+ Musculoskeletal: no deformities, strength intact in all 4 Skin: moist, warm, no rashes Neurological: no tremor with outstretched hands, DTR normal in all 4  ASSESSMENT: 1. Hypothyroidism - mild - Hashimoto's thyroiditis - dx 10/2013 - tried LT4 (Synthroid) >> acne, bloating  PLAN:  1. Patient with mild Hashimoto's hypothyroidism (first visit after dx of autoimmunity), not on levothyroxine therapy. She appears euthyroid, but has many sxs that can be attributed to hypothyroidism. She does not appear to have a goiter, thyroid nodules, or neck compression symptoms. - she tried  Synthroid DAW and could not tolerate it >> bloating, acne. We again discussed about other therapeutic options if her TSH remains high, especially as she has sxs suggestive of hypothyroidism. - we discussed about options:  Use Levothyroxine generic  Use the 50 mcg Synthroid tablet as this does not contain dyes and other excipients   Using Tirosint - we will check thyroid tests today - If these are abnormal, we may need to start LT4 25 mcg  (1/2 tab of 50 mcg)  And she will need to return in 6-8 weeks for repeat labs - I suggested to try Selenium 2x a day to decrease Thyroid Ab titers. Discussed available evidence for this. - I also suggested a vegan diet to help her fatigue  - If these are normal, I will see her back in 6 mo  Office Visit on 01/30/2015  Component Date Value Ref Range Status  . Free T4 01/30/2015 0.61  0.60 - 1.60 ng/dL Final  . T3, Free 01/30/2015 2.3  2.3 - 4.2 pg/mL Final  . TSH 01/30/2015 5.35* 0.35 - 4.50 uIU/mL Final   TSH higher than normal, slightly better than at last check. Free T4 and free T3 at the lower limit of normal. I would suggest treatment at this point, with at least 25 g daily; will see if she prefers to try one formulation of levothyroxine over another.

## 2015-01-30 NOTE — Patient Instructions (Signed)
Please stop at the lab.  You can try Selenium 2x a day.  Please return in 6 months.  Try a vegan diet.  Please consider the following ways to cut down carbs and fat and increase fiber and micronutrients in your diet:  - substitute whole grain for white bread or pasta - substitute brown rice for white rice - substitute 90-calorie flatbread pieces for slices of bread when possible - substitute sweet potatoes or yams for white potatoes - substitute humus for margarine - substitute tofu for cheese when possible - substitute almond or rice milk for regular milk - substitute dark chocolate for other sweets when possible - substitute water - can add lemon/orange/lime/kiwi slices for taste - for diet sodas (artificial sweeteners will trick your body that you can eat sweets without getting calories and will lead you to overeating and weight gain in the long run) - do not skip breakfast or other meals (this will slow down the metabolism and will result in more weight gain over time)  - can try smoothies made from fruit and almond/rice milk in am instead of regular breakfast - can also try old-fashioned (not instant) oatmeal made with almond/rice milk in am - order the dressing on the side when eating salad at a restaurant (pour less than half of the dressing on the salad) - eat as little meat as possible  - can try juicing, but should not forget that juicing will get rid of the fiber, so would alternate with eating raw veg./fruits or drinking smoothies - use as little oil as possible, even when using olive oil - can dress a salad with a mix of balsamic vinegar and lemon juice, for e.g. - use agave nectar, stevia sugar, or regular sugar rather than artificial sweateners - steam or broil/roast veggies  - snack on veggies/fruit/nuts (unsalted, preferably) when possible, rather than processed foods - reduce or eliminate aspartame in diet (it is in diet sodas, chewing gum, etc) Read the labels!  Try  to read Dr. Janene Harvey book: "Program for Reversing Diabetes" for the vegan concept and other ideas for healthy eating.  Plant-based diet materials: - Lectures (you tube):  Alyssa Grove: "Breaking the Food Seduction"  Doug Lisle: "How to Lose Weight, without Losing Your Mind" - Documentaries:  [place] over [employer], Sick and Nearly Dead  The [employer]  Overweight and undernourished - Books:  Alyssa Grove: "Program for Reversing Diabetes"  Heath Gold: "The [place] Study"  Norma Fredrickson: "Supermarket Vegan" (cookbook) - Facebook pages:   Moshe Salisbury versus Knives  Vegucated  [employer] - Healthy nutrition info websites:  https://www.martin.info/

## 2015-02-16 ENCOUNTER — Encounter: Payer: Self-pay | Admitting: Internal Medicine

## 2015-07-30 ENCOUNTER — Ambulatory Visit: Payer: BLUE CROSS/BLUE SHIELD | Admitting: Internal Medicine

## 2015-10-05 ENCOUNTER — Encounter: Payer: Self-pay | Admitting: Internal Medicine

## 2015-10-05 ENCOUNTER — Ambulatory Visit (INDEPENDENT_AMBULATORY_CARE_PROVIDER_SITE_OTHER): Payer: BLUE CROSS/BLUE SHIELD | Admitting: Internal Medicine

## 2015-10-05 VITALS — BP 110/70 | HR 76 | Wt 122.0 lb

## 2015-10-05 DIAGNOSIS — R413 Other amnesia: Secondary | ICD-10-CM

## 2015-10-05 DIAGNOSIS — E063 Autoimmune thyroiditis: Secondary | ICD-10-CM | POA: Diagnosis not present

## 2015-10-05 NOTE — Patient Instructions (Addendum)
Please hold the Biotin for the next 2 days, then come for labs.  Start a multivitamin.

## 2015-10-05 NOTE — Progress Notes (Addendum)
Patient ID: Melissa Johnson, female   DOB: 1970/10/29, 45 y.o.   MRN: [id number]   HPI  Melissa Johnson is a 45 y.o.-year-old female, returning for f/u for Hashimoto's thyroiditis. Last visit 8 mo ago.  Since last visit, she cut out dairy b/c joint pain and bloating >> lost 6 lbs.  Reviewed hx: Pt. has been dx with hypothyroidism in 11/2012 (had hair loss and brittle nails); she started Synthroid DAW then >> felt worse (more hair loss, fatigue) and could not tolerate it: bloating, nausea >> stopped 04/2013. She would not want to restart it.   8 weeks after she stopped the LT4 >> labs: 09/16/2013: TSH 5.7 (0.34-4.5), free t4 0.55 (0.61-1.12)  I reviewed pt's previous thyroid tests: Component     Latest Ref Rng 10/30/2013 12/31/2013 01/29/2014 01/30/2015  TSH     0.35 - 4.50 uIU/mL 0.81 5.82 (H) 6.17 (H) 5.35 (H)  T4,Free(Direct)     0.60 - 1.60 ng/dL 0.57 (L) 0.60 0.68 0.61  Triiodothyronine,Free,Serum     2.3 - 4.2 pg/mL 2.2 (L) 2.2 (L) 2.7 2.3  Previously: 09/16/2013: TSH 5.7 (0.34-4.5), free t4 0.55 (0.61-1.12) 04/29/2013: TSH 1.83, free t4 0.65 01/18/2013: TSH 1.46 11/2012: TSH 6  Thyroid Ab's were positive:  Component     Latest Ref Rng 10/29/2013  Thyroid Peroxidase Antibody     <35.0 IU/mL 437.0 (H)  Thyroglobulin Ab     <40.0 IU/mL <20.0   We discussed about starting LT4 - preferred not to start as asymptomatic.  Pt describes: - + fatigue - takes 20-min naps - + foggy brain, + memory loss - + hair loss - improving on Hair Skin and Nails - + bloating and discomfort - better after stopped Gluten and now dairy - + Joint pain - better after being stricter on the gluten-free diet - + dry mouth, feels tongue swollen - pt was surprised to find out she lost 6 lbs since last visit. - no heat or cold intolerance - + constipation - no dry skin  Pt denies feeling nodules in neck, hoarseness, dysphagia/odynophagia, SOB with lying down.  She takes: - vitamin D - started Hair Skin  Nails. Last dose today! - started B complex. Last dose today! - probiotic Stopped MVI several mo ago.   ROS: Constitutional: see HPI Eyes: no blurry vision, no xerophthalmia ENT: no sore throat, no nodules palpated in throat, no dysphagia/odynophagia, no hoarseness Cardiovascular: no CP/SOB/palpitations/leg swelling Respiratory: no cough/SOB Gastrointestinal: no N/V/D/+ C Musculoskeletal:no muscle/+ joint aches (off and on) Skin: no rashes Neurological: no tremors/numbness/tingling/dizziness  I reviewed pt's medications, allergies, PMH, social hx, family hx, and changes were documented in the history of present illness. Otherwise, unchanged from my initial visit note.  PE: BP 110/70 mmHg  Pulse 76  Wt 122 lb (55.339 kg)  SpO2 99%  LMP 09/14/2015 Body mass index is 22.14 kg/(m^2). Wt Readings from Last 3 Encounters:  10/05/15 122 lb (55.339 kg)  01/30/15 123 lb (55.792 kg)  12/22/14 128 lb 4.8 oz (58.196 kg)   Constitutional: normal weight, in NAD Eyes: PERRLA, EOMI, no exophthalmos ENT: moist mucous membranes, no thyromegaly, no cervical lymphadenopathy Cardiovascular: RRR, No MRG Respiratory: CTA B Gastrointestinal: abdomen soft, NT, ND, BS+ Musculoskeletal: no deformities, strength intact in all 4 Skin: moist, warm, no rashes Neurological: no tremor with outstretched hands, DTR normal in all 4  ASSESSMENT: 1. Hypothyroidism - mild - Hashimoto's thyroiditis - dx 10/2013 - tried LT4 (Synthroid) >> acne, bloating  2. Memory  loss  PLAN:  1. Patient with mild Hashimoto's hypothyroidism (first visit after dx of autoimmunity), not on levothyroxine therapy. She appears euthyroid, but still has many sxs that can be attributed to hypothyroidism. The one bothering her most is memory loss: cannot remember things, difficult to find words. She does not appear to have a goiter, thyroid nodules, or neck compression symptoms. - she tried Synthroid DAW and could not tolerate it >>  bloating, acne. We again discussed about other therapeutic options if her TSH remains high, especially as she has sxs suggestive of hypothyroidism.  - we have the following options:  Use Levothyroxine generic  Use the 50 mcg Synthroid tablet as this does not contain dyes and other excipients   Using Tirosint - we will check thyroid function tests + TPO Abs in 3 days (needsto be off Biotin for 2 days) - If these are abnormal, we may start Selenium to decrease the antibody titer, iodine in MVI, or LT4 25 mcg (1/2 tab of 50 mcg) and she will need to return in 6 weeks for repeat labs - If labs are normal, I will see her back in 6 mo  2. Memory loss - also see above - will check: Orders Placed This Encounter  Procedures  . TSH  . VITAMIN D 25 Hydroxy (Vit-D Deficiency, Fractures)  . Vitamin B12  . T4, free  . T3, free  . Thyroid peroxidase antibody   Component     Latest Ref Rng 10/07/2015  Thyroperoxidase Ab SerPl-aCnc     <9 IU/mL 102 (H)  TSH     0.35 - 4.50 uIU/mL 4.44  T4,Free(Direct)     0.60 - 1.60 ng/dL 0.52 (L)  Triiodothyronine,Free,Serum     2.3 - 4.2 pg/mL 2.5  VITD     30.00 - 100.00 ng/mL 37.74  Vitamin B12     211 - 911 pg/mL 322   Normal vitamin B12, vitamin D, and TSH. The free T4 is slightly low. The TPO antibodies are improved. There is no clear indication for starting levothyroxine for now. I will recheck her tests in 6 months.  Philemon Kingdom, MD PhD [facility]

## 2015-10-07 ENCOUNTER — Other Ambulatory Visit (INDEPENDENT_AMBULATORY_CARE_PROVIDER_SITE_OTHER): Payer: BLUE CROSS/BLUE SHIELD

## 2015-10-07 DIAGNOSIS — R413 Other amnesia: Secondary | ICD-10-CM | POA: Diagnosis not present

## 2015-10-07 DIAGNOSIS — E063 Autoimmune thyroiditis: Secondary | ICD-10-CM | POA: Diagnosis not present

## 2015-10-07 LAB — TSH: TSH: 4.44 u[IU]/mL (ref 0.35–4.50)

## 2015-10-07 LAB — VITAMIN D 25 HYDROXY (VIT D DEFICIENCY, FRACTURES): VITD: 37.74 ng/mL (ref 30.00–100.00)

## 2015-10-07 LAB — T4, FREE: FREE T4: 0.52 ng/dL — AB (ref 0.60–1.60)

## 2015-10-07 LAB — T3, FREE: T3, Free: 2.5 pg/mL (ref 2.3–4.2)

## 2015-10-07 LAB — VITAMIN B12: VITAMIN B 12: 322 pg/mL (ref 211–911)

## 2015-10-08 LAB — THYROID PEROXIDASE ANTIBODY: THYROID PEROXIDASE ANTIBODY: 102 [IU]/mL — AB (ref <9)

## 2015-10-22 ENCOUNTER — Ambulatory Visit (INDEPENDENT_AMBULATORY_CARE_PROVIDER_SITE_OTHER): Payer: BLUE CROSS/BLUE SHIELD | Admitting: Family Medicine

## 2015-10-22 VITALS — BP 98/68 | HR 102 | Temp 99.6°F | Ht 62.25 in | Wt 121.4 lb

## 2015-10-22 DIAGNOSIS — R300 Dysuria: Secondary | ICD-10-CM

## 2015-10-22 LAB — POCT URINALYSIS DIPSTICK
Bilirubin, UA: NEGATIVE
Glucose, UA: NEGATIVE
Ketones, UA: NEGATIVE
Nitrite, UA: NEGATIVE
Spec Grav, UA: 1.01
Urobilinogen, UA: 0.2
pH, UA: 7.5

## 2015-10-22 MED ORDER — NITROFURANTOIN MONOHYD MACRO 100 MG PO CAPS: 100.0000 mg | ORAL_CAPSULE | Freq: Two times a day (BID) | ORAL | 0 refills | Status: DC

## 2015-10-22 NOTE — Addendum Note (Signed)
Addended by: Gari Crown D on: 10/22/2015 12:16 PM   Modules accepted: Orders

## 2015-10-22 NOTE — Patient Instructions (Signed)
BEFORE YOU LEAVE: -follow up: Physical in 2-4 months; morning appointment if possible, come fasting if morning appointment - ok if not fasting otherwise   Take the medication as instructed.  Cranberry and blueberry juice.  Follow up if worsening or persistent symptoms.

## 2015-10-22 NOTE — Progress Notes (Signed)
Pre visit review using our clinic review tool, if applicable. No additional management support is needed unless otherwise documented below in the visit note. 

## 2015-10-22 NOTE — Addendum Note (Signed)
Addended by: Agnes Lawrence on: 10/22/2015 12:14 PM   Modules accepted: Orders

## 2015-10-22 NOTE — Addendum Note (Signed)
Addended by: Agnes Lawrence on: 10/22/2015 11:10 AM   Modules accepted: Orders

## 2015-10-22 NOTE — Progress Notes (Signed)
HPI:  Melissa Johnson is a pleasant 45 year old here for an acute visit for dysuria. This started 2 weeks ago. Symptoms include mild discomfort with urination, chills several times, possible low-grade temperature. She denies documented fever, nausea, vomiting, diarrhea, flank pain, vaginal discharge, abdominal or pelvic pain, hematuria. She is currently just finishing up her menstrual periods. She was taking blueberry and cranberry juice daily and this has prevented UTIs but she stopped several weeks before the symptoms started.  ROS: See pertinent positives and negatives per HPI.  Past Medical History:  Diagnosis Date  . Allergy   . Arthritis    OA, DDD  . Chicken pox   . History of UTI   . Hypothyroidism     No past surgical history on file.  Family History  Problem Relation Age of Onset  . Heart disease Father     CAD  . Heart disease Paternal Grandmother     Social History   Social History  . Marital status: Married    Spouse name: N/A  . Number of children: N/A  . Years of education: N/A   Social History Main Topics  . Smoking status: Former Research scientist (life sciences)  . Smokeless tobacco: Not on file     Comment: only in highschool and college  . Alcohol use Yes     Comment: 1 glass of wine here and there  . Drug use: No  . Sexual activity: Not on file   Other Topics Concern  . Not on file   Social History Narrative   Work or School: Teaches preschool - part-time      Home Situation: lives with husband and two kids (26 and 13 - 2015)      Spiritual Beliefs: Christian      Lifestyle: no regular exercise; diet is healthy and gluten free              Current Outpatient Prescriptions:  .  fexofenadine (ALLEGRA) 180 MG tablet, Take 180 mg by mouth daily., Disp: , Rfl:  .  fluticasone (FLONASE) 50 MCG/ACT nasal spray, 2 sprays each nostril daily for 1 month, then 1 spray each nostril daily, Disp: 16 g, Rfl: 1 .  Multiple Vitamins-Minerals (HAIR SKIN AND NAILS FORMULA) TABS,  Take by mouth., Disp: , Rfl:  .  Probiotic Product (ALIGN) 4 MG CAPS, Take 1 capsule by mouth daily., Disp: , Rfl:  .  nitrofurantoin, macrocrystal-monohydrate, (MACROBID) 100 MG capsule, Take 1 capsule (100 mg total) by mouth 2 (two) times daily., Disp: 14 capsule, Rfl: 0  EXAM:  Vitals:   10/22/15 1031  BP: 98/68  Temp: 99.6 F (37.6 C)    Body mass index is 22.03 kg/m.  GENERAL: vitals reviewed and listed above, alert, oriented, appears well hydrated and in no acute distress  HEENT: atraumatic, conjunttiva clear, no obvious abnormalities on inspection of external nose and ears  NECK: no obvious masses on inspection  LUNGS: clear to auscultation bilaterally, no wheezes, rales or rhonchi, good air movement  CV: HRRR, no peripheral edema  MS: moves all extremities without noticeable abnormality  ABD: Bowel sounds positive, soft, nontender to palpation, no CVA tenderness  PSYCH: pleasant and cooperative, no obvious depression or anxiety  ASSESSMENT AND PLAN:  Discussed the following assessment and plan:  Dysuria - Plan: POC Urinalysis Dipstick  -udip abnorm, she opted for empiric tx while awaiting culture -due for CPE - does gyn exams with her gynecologist -Patient advised to return or notify a doctor immediately if symptoms  worsen or persist or new concerns arise.  Patient Instructions  BEFORE YOU LEAVE: -follow up: Physical in 2-4 months; morning appointment if possible, come fasting if morning appointment - ok if not fasting otherwise   Take the medication as instructed.  Cranberry and blueberry juice.  Follow up if worsening or persistent symptoms.   Colin Benton R., DO

## 2015-10-24 LAB — URINE CULTURE: Colony Count: >=100000

## 2016-02-23 ENCOUNTER — Ambulatory Visit (INDEPENDENT_AMBULATORY_CARE_PROVIDER_SITE_OTHER): Payer: BLUE CROSS/BLUE SHIELD | Admitting: Family Medicine

## 2016-02-23 ENCOUNTER — Encounter: Payer: Self-pay | Admitting: Family Medicine

## 2016-02-23 VITALS — BP 90/62 | HR 81 | Temp 98.9°F | Ht 62.0 in | Wt 120.0 lb

## 2016-02-23 DIAGNOSIS — K429 Umbilical hernia without obstruction or gangrene: Secondary | ICD-10-CM

## 2016-02-23 DIAGNOSIS — Z0000 Encounter for general adult medical examination without abnormal findings: Secondary | ICD-10-CM | POA: Diagnosis not present

## 2016-02-23 LAB — LIPID PANEL
CHOLESTEROL: 204 mg/dL — AB (ref 0–200)
HDL: 58.6 mg/dL (ref >39.00)
LDL Cholesterol: 128 mg/dL — ABNORMAL HIGH (ref 0–99)
NonHDL: 145.38
TRIGLYCERIDES: 86 mg/dL (ref 0.0–149.0)
Total CHOL/HDL Ratio: 3
VLDL: 17.2 mg/dL (ref 0.0–40.0)

## 2016-02-23 LAB — HEMOGLOBIN A1C: Hgb A1c MFr Bld: 5.3 % (ref 4.6–6.5)

## 2016-02-23 NOTE — Patient Instructions (Signed)
BEFORE YOU LEAVE: -follow up: yearly and as needed -labs  -We placed a referral for you as discussed to the surgeon for the hernia. It usually takes about 1-2 weeks to process and schedule this referral. If you have not heard from [place] regarding this appointment in 2 weeks please contact our office.  We have ordered labs or studies at this visit. It can take up to 1-2 weeks for results and processing. IF results require follow up or explanation, we will call you with instructions. Clinically stable results will be released to your [facility]. If you have not heard from [place] or cannot find your results in [facility] in 2 weeks please contact our office at [phone].   If you are not yet signed up for [facility], please SIGN UP TODAY. We now offer online scheduling, same day appointments and extended hours. WHEN YOU DON'T FEEL YOUR BEST.Marland KitchenMarland KitchenWE ARE HERE TO HELP.   We recommend the following healthy lifestyle for LIFE: 1) Small portions.   Tip: eat off of a salad plate instead of a dinner plate.  Tip: if you need more or a snack choose fruits, veggies and/or a handful of nuts or seeds.  2) Eat a healthy clean diet.  * Tip: Avoid (less then 1 serving per week): processed foods, sweets, sweetened drinks, white starches (rice, flour, bread, potatoes, pasta, etc), red meat, fast foods, butter  *Tip: CHOOSE instead   * 5-9 servings per day of fresh or frozen fruits and vegetables (but not corn, potatoes, bananas, canned or dried fruit)   *nuts and seeds, beans   *olives and olive oil   *small portions of lean meats such as fish and white chicken    *small portions of whole grains  3)Get at least 150 minutes of sweaty aerobic exercise per week.  4)Reduce stress - consider counseling, meditation and relaxation to balance other aspects of your life.

## 2016-02-23 NOTE — Progress Notes (Signed)
HPI:  Here for CPE:  -Concerns and/or follow up today: Hx seasonal allergies and hashimoto's, managed by Dr. Cruzita Lederer, endocrinology. Sees Dr. Matthew Saras for women's health exams.  Due for lipid and diabetes screening and flu shot. Wants referral to surgeon for discussion options regarding small umbilical hernia. Reports has had for > 10 years and does not cause any discomfort.  -Diet: variety of foods, balance and well rounded -Exercise: no regular exercise  -Taking folic acid, vitamin D or calcium: no  -Vaccines: refuses flu vaccine  -pap history: Sees Dr. Matthew Saras in gyn  -sexual activity: yes, female partner, no new partners  -wants STI testing (Hep C if born 48-65): no  -FH breast, colon or ovarian ca: see FH Last mammogram: n/a Last colon cancer screening: n/a   -Alcohol, Tobacco, drug use: see social history  Review of Systems - no fevers, unintentional weight loss, vision loss, hearing loss, chest pain, sob, hemoptysis, melena, hematochezia, hematuria, genital discharge, changing or concerning skin lesions, bleeding, bruising, loc, thoughts of self harm or SI  Past Medical History:  Diagnosis Date  . Allergy   . Arthritis    OA, DDD  . Chicken pox   . History of UTI   . Hypothyroidism     No past surgical history on file.  Family History  Problem Relation Age of Onset  . Heart disease Father     CAD  . Heart disease Paternal Grandmother     Social History   Social History  . Marital status: Married    Spouse name: N/A  . Number of children: N/A  . Years of education: N/A   Social History Main Topics  . Smoking status: Former Research scientist (life sciences)  . Smokeless tobacco: None     Comment: only in highschool and college  . Alcohol use Yes     Comment: 1 glass of wine here and there  . Drug use: No  . Sexual activity: Not Asked   Other Topics Concern  . None   Social History Narrative   Work or School: Teaches preschool - part-time      Home Situation: lives  with husband and two kids (49 and 62 - 2015)      Spiritual Beliefs: Christian      Lifestyle: no regular exercise; diet is healthy and gluten free              Current Outpatient Prescriptions:  .  fexofenadine (ALLEGRA) 180 MG tablet, Take 180 mg by mouth daily., Disp: , Rfl:  .  fluticasone (FLONASE) 50 MCG/ACT nasal spray, 2 sprays each nostril daily for 1 month, then 1 spray each nostril daily, Disp: 16 g, Rfl: 1 .  Multiple Vitamins-Minerals (HAIR SKIN AND NAILS FORMULA) TABS, Take by mouth., Disp: , Rfl:  .  nitrofurantoin, macrocrystal-monohydrate, (MACROBID) 100 MG capsule, Take 1 capsule (100 mg total) by mouth 2 (two) times daily., Disp: 14 capsule, Rfl: 0 .  Probiotic Product (ALIGN) 4 MG CAPS, Take 1 capsule by mouth daily., Disp: , Rfl:   EXAM:  Vitals:   02/23/16 0811  BP: 90/62  Pulse: 81  Temp: 98.9 F (37.2 C)    GENERAL: vitals reviewed and listed below, alert, oriented, appears well hydrated and in no acute distress  HEENT: head atraumatic, PERRLA, normal appearance of eyes, ears, nose and mouth. moist mucus membranes.  NECK: supple, no masses or lymphadenopathy  LUNGS: clear to auscultation bilaterally, no rales, rhonchi or wheeze  CV: HRRR, no peripheral edema or  cyanosis, normal pedal pulses  BREAST: declined, does with gyn  ABDOMEN: bowel sounds normal, soft, non tender to palpation, no masses, no rebound or guarding, small reducible umbilical hernia  GU: declined, does with gyn  SKIN: no rash or abnormal lesions  MS: normal gait, moves all extremities normally  NEURO: normal gait, speech and thought processing grossly intact, muscle tone grossly intact throughout  PSYCH: normal affect, pleasant and cooperative  ASSESSMENT AND PLAN:  Discussed the following assessment and plan:  Encounter for preventive health examination - Plan: Lipid Panel, Hemoglobin A1C -Discussed and advised all [place] preventive services health task force level A and  B recommendations for age, sex and risks. -Advised at least 150 minutes of exercise per week and a healthy diet with avoidance of (less then 1 serving per week) processed foods, white starches, red meat, fast foods and sweets and consisting of: * 5-9 servings of fresh fruits and vegetables (not corn or potatoes) *nuts and seeds, beans *olives and olive oil *lean meats such as fish and white chicken  *whole grains -labs, studies and vaccines per orders this encounter  Umbilical hernia without obstruction and without gangrene - Plan: Ambulatory referral to General Surgery -she wanted eval for this to discuss repair options, no symptoms, potential complications/precuations discussed.    Orders Placed This Encounter  Procedures  . Lipid Panel  . Hemoglobin A1C  . Ambulatory referral to General Surgery    Referral Priority:   Routine    Referral Type:   Surgical    Referral Reason:   Specialty Services Required    Requested Specialty:   General Surgery    Number of Visits Requested:   1    Patient advised to return to clinic immediately if symptoms worsen or persist or new concerns.  Patient Instructions  BEFORE YOU LEAVE: -follow up: yearly and as needed -labs  -We placed a referral for you as discussed to the surgeon for the hernia. It usually takes about 1-2 weeks to process and schedule this referral. If you have not heard from [place] regarding this appointment in 2 weeks please contact our office.  We have ordered labs or studies at this visit. It can take up to 1-2 weeks for results and processing. IF results require follow up or explanation, we will call you with instructions. Clinically stable results will be released to your [facility]. If you have not heard from [place] or cannot find your results in [facility] in 2 weeks please contact our office at [phone].   If you are not yet signed up for [facility], please SIGN UP TODAY. We now offer online scheduling, same day appointments and  extended hours. WHEN YOU DON'T FEEL YOUR BEST.Marland KitchenMarland KitchenWE ARE HERE TO HELP.   We recommend the following healthy lifestyle for LIFE: 1) Small portions.   Tip: eat off of a salad plate instead of a dinner plate.  Tip: if you need more or a snack choose fruits, veggies and/or a handful of nuts or seeds.  2) Eat a healthy clean diet.  * Tip: Avoid (less then 1 serving per week): processed foods, sweets, sweetened drinks, white starches (rice, flour, bread, potatoes, pasta, etc), red meat, fast foods, butter  *Tip: CHOOSE instead   * 5-9 servings per day of fresh or frozen fruits and vegetables (but not corn, potatoes, bananas, canned or dried fruit)   *nuts and seeds, beans   *olives and olive oil   *small portions of lean meats such as fish and white  chicken    *small portions of whole grains  3)Get at least 150 minutes of sweaty aerobic exercise per week.  4)Reduce stress - consider counseling, meditation and relaxation to balance other aspects of your life.          No Follow-up on file.  Colin Benton R., DO

## 2016-02-23 NOTE — Progress Notes (Signed)
Pre visit review using our clinic review tool, if applicable. No additional management support is needed unless otherwise documented below in the visit note. 

## 2016-03-07 DIAGNOSIS — K429 Umbilical hernia without obstruction or gangrene: Secondary | ICD-10-CM | POA: Diagnosis not present

## 2016-04-07 ENCOUNTER — Encounter: Payer: Self-pay | Admitting: Internal Medicine

## 2016-04-07 ENCOUNTER — Ambulatory Visit (INDEPENDENT_AMBULATORY_CARE_PROVIDER_SITE_OTHER): Payer: BLUE CROSS/BLUE SHIELD | Admitting: Internal Medicine

## 2016-04-07 ENCOUNTER — Other Ambulatory Visit: Payer: Self-pay | Admitting: Internal Medicine

## 2016-04-07 VITALS — BP 122/74 | HR 73 | Ht 63.5 in | Wt 124.0 lb

## 2016-04-07 DIAGNOSIS — E063 Autoimmune thyroiditis: Secondary | ICD-10-CM

## 2016-04-07 LAB — T4, FREE: FREE T4: 0.55 ng/dL — AB (ref 0.60–1.60)

## 2016-04-07 LAB — TSH: TSH: 3.13 u[IU]/mL (ref 0.35–4.50)

## 2016-04-07 LAB — T3, FREE: T3, Free: 2.6 pg/mL (ref 2.3–4.2)

## 2016-04-07 NOTE — Patient Instructions (Signed)
Please stop at the lab.  Please return in 1 year, but for labs in 6 months.

## 2016-04-07 NOTE — Progress Notes (Signed)
Patient ID: Melissa Johnson, female   DOB: Jul 08, 1970, 46 y.o.   MRN: [id number]   HPI  Melissa Johnson is a 46 y.o.-year-old female, returning for f/u for Hashimoto's thyroiditis. Last visit 6 mo ago.  Reviewed hx: Pt. has been dx with hypothyroidism in 11/2012 (had hair loss and brittle nails); she started Synthroid DAW then >> felt worse (more hair loss, fatigue) and could not tolerate it: bloating, nausea >> stopped 04/2013. She would not want to restart it.   8 weeks after she stopped the LT4 >> labs: 09/16/2013: TSH 5.7 (0.34-4.5), free t4 0.55 (0.61-1.12)  I reviewed pt's previous thyroid tests: Lab Results  Component Value Date   TSH 4.44 10/07/2015   TSH 5.35 (H) 01/30/2015   TSH 6.17 (H) 01/29/2014   TSH 5.82 (H) 12/31/2013   TSH 0.81 10/30/2013   FREET4 0.52 (L) 10/07/2015   FREET4 0.61 01/30/2015   FREET4 0.68 01/29/2014   FREET4 0.60 12/31/2013   FREET4 0.57 (L) 10/30/2013  Previously: 09/16/2013: TSH 5.7 (0.34-4.5), free t4 0.55 (0.61-1.12) 04/29/2013: TSH 1.83, free t4 0.65 01/18/2013: TSH 1.46 11/2012: TSH 6  Thyroid Ab's were positive:  Component     Latest Ref Rng 10/07/2015  Thyroperoxidase Ab SerPl-aCnc     <9 IU/mL 102 (H)   Component     Latest Ref Rng 10/29/2013  Thyroid Peroxidase Antibody     <35.0 IU/mL 437.0 (H)  Thyroglobulin Ab     <40.0 IU/mL <20.0   We discussed about starting LT4 at previous visits >> her preference is to stay off the medicine as long as it is safe.  Pt describes: - + fatigue >> improving - + foggy brain - + hair loss - stopped Hair Skin and Nails, now on Centrum  - + Joint pain - better after being stricter on the gluten-free diet - pt was surprised to find out she lost 6 lbs since last visit. - no heat or cold intolerance - no constipation - no dry skin  Pt denies feeling nodules in neck, hoarseness, dysphagia/odynophagia, SOB with lying down.  She takes: - vitamin D - probiotic - centrum silver  B12 and D  vitamins were normal at last visit.  ROS: Constitutional: see HPI Eyes: no blurry vision, no xerophthalmia ENT: no sore throat, no nodules palpated in throat, no dysphagia/odynophagia, no hoarseness Cardiovascular: no CP/SOB/palpitations/leg swelling Respiratory: no cough/SOB Gastrointestinal: no N/V/D/C Musculoskeletal: + muscle/+ joint aches (off and on) Skin: no rashes, + hair loss Neurological: no tremors/numbness/tingling/dizziness  I reviewed pt's medications, allergies, PMH, social hx, family hx, and changes were documented in the history of present illness. Otherwise, unchanged from my initial visit note.  PE: BP 122/74 (BP Location: Left Arm, Patient Position: Sitting)   Pulse 73   Ht 5' 3.5" (1.613 m)   Wt 124 lb (56.2 kg)   LMP 03/28/2016   SpO2 98%   BMI 21.62 kg/m  Body mass index is 21.62 kg/m. Wt Readings from Last 3 Encounters:  04/07/16 124 lb (56.2 kg)  02/23/16 120 lb (54.4 kg)  10/22/15 121 lb 6.4 oz (55.1 kg)   Constitutional: normal weight, in NAD Eyes: PERRLA, EOMI, no exophthalmos ENT: moist mucous membranes, no thyromegaly, no cervical lymphadenopathy Cardiovascular: RRR, No MRG Respiratory: CTA B Gastrointestinal: abdomen soft, NT, ND, BS+ Musculoskeletal: no deformities, strength intact in all 4 Skin: moist, warm, no rashes Neurological: no tremor with outstretched hands, DTR normal in all 4  ASSESSMENT: 1. Hypothyroidism - mild - Hashimoto's  thyroiditis - dx 10/2013 - tried LT4 (Synthroid) >> acne, bloating  PLAN:  1. Patient with mild Hashimoto's hypothyroidism, not on levothyroxine therapy. She appears euthyroid, but still has some fatigue - she tried Synthroid DAW and could not tolerate it >> bloating, acne. Other therapeutic options if her TSH remains high:  Use Levothyroxine generic  Use the 50 mcg Synthroid tablet as this does not contain dyes and other excipients   Using Tirosint - we will check thyroid function tests today  -  If labs are normal, I will see her back in 1 year, but check labs again in 6 mo  Component     Latest Ref Rng & Units 04/07/2016  TSH     0.35 - 4.50 uIU/mL 3.13  T4,Free(Direct)     0.60 - 1.60 ng/dL 0.55 (L)  Triiodothyronine,Free,Serum     2.3 - 4.2 pg/mL 2.6   Slightly low free T4, with normal TSH and free T3. No intervention is needed for now.  Philemon Kingdom, MD PhD [facility] End[facility]

## 2016-10-04 ENCOUNTER — Other Ambulatory Visit: Payer: BLUE CROSS/BLUE SHIELD

## 2016-10-10 ENCOUNTER — Other Ambulatory Visit (INDEPENDENT_AMBULATORY_CARE_PROVIDER_SITE_OTHER): Payer: BLUE CROSS/BLUE SHIELD

## 2016-10-10 DIAGNOSIS — E063 Autoimmune thyroiditis: Secondary | ICD-10-CM | POA: Diagnosis not present

## 2016-10-10 LAB — T4, FREE: Free T4: 0.65 ng/dL (ref 0.60–1.60)

## 2016-10-10 LAB — T3, FREE: T3, Free: 3.1 pg/mL (ref 2.3–4.2)

## 2016-10-10 LAB — TSH: TSH: 3.47 u[IU]/mL (ref 0.35–4.50)

## 2016-10-11 ENCOUNTER — Other Ambulatory Visit: Payer: BLUE CROSS/BLUE SHIELD

## 2016-12-08 DIAGNOSIS — Z1231 Encounter for screening mammogram for malignant neoplasm of breast: Secondary | ICD-10-CM | POA: Diagnosis not present

## 2016-12-08 DIAGNOSIS — Z01419 Encounter for gynecological examination (general) (routine) without abnormal findings: Secondary | ICD-10-CM | POA: Diagnosis not present

## 2016-12-08 DIAGNOSIS — Z6822 Body mass index (BMI) 22.0-22.9, adult: Secondary | ICD-10-CM | POA: Diagnosis not present

## 2016-12-15 ENCOUNTER — Encounter: Payer: Self-pay | Admitting: Family Medicine

## 2017-02-08 DIAGNOSIS — M531 Cervicobrachial syndrome: Secondary | ICD-10-CM | POA: Diagnosis not present

## 2017-02-08 DIAGNOSIS — M5137 Other intervertebral disc degeneration, lumbosacral region: Secondary | ICD-10-CM | POA: Diagnosis not present

## 2017-02-08 DIAGNOSIS — M9901 Segmental and somatic dysfunction of cervical region: Secondary | ICD-10-CM | POA: Diagnosis not present

## 2017-02-08 DIAGNOSIS — M9903 Segmental and somatic dysfunction of lumbar region: Secondary | ICD-10-CM | POA: Diagnosis not present

## 2017-02-10 DIAGNOSIS — M9903 Segmental and somatic dysfunction of lumbar region: Secondary | ICD-10-CM | POA: Diagnosis not present

## 2017-02-10 DIAGNOSIS — M531 Cervicobrachial syndrome: Secondary | ICD-10-CM | POA: Diagnosis not present

## 2017-02-10 DIAGNOSIS — M5137 Other intervertebral disc degeneration, lumbosacral region: Secondary | ICD-10-CM | POA: Diagnosis not present

## 2017-02-10 DIAGNOSIS — M9901 Segmental and somatic dysfunction of cervical region: Secondary | ICD-10-CM | POA: Diagnosis not present

## 2017-02-15 DIAGNOSIS — M531 Cervicobrachial syndrome: Secondary | ICD-10-CM | POA: Diagnosis not present

## 2017-02-15 DIAGNOSIS — M5137 Other intervertebral disc degeneration, lumbosacral region: Secondary | ICD-10-CM | POA: Diagnosis not present

## 2017-02-15 DIAGNOSIS — M9903 Segmental and somatic dysfunction of lumbar region: Secondary | ICD-10-CM | POA: Diagnosis not present

## 2017-02-15 DIAGNOSIS — M9901 Segmental and somatic dysfunction of cervical region: Secondary | ICD-10-CM | POA: Diagnosis not present

## 2017-03-01 DIAGNOSIS — M9903 Segmental and somatic dysfunction of lumbar region: Secondary | ICD-10-CM | POA: Diagnosis not present

## 2017-03-01 DIAGNOSIS — M9901 Segmental and somatic dysfunction of cervical region: Secondary | ICD-10-CM | POA: Diagnosis not present

## 2017-03-01 DIAGNOSIS — M531 Cervicobrachial syndrome: Secondary | ICD-10-CM | POA: Diagnosis not present

## 2017-03-01 DIAGNOSIS — M5137 Other intervertebral disc degeneration, lumbosacral region: Secondary | ICD-10-CM | POA: Diagnosis not present

## 2017-03-03 ENCOUNTER — Encounter: Payer: BLUE CROSS/BLUE SHIELD | Admitting: Family Medicine

## 2017-03-15 DIAGNOSIS — M531 Cervicobrachial syndrome: Secondary | ICD-10-CM | POA: Diagnosis not present

## 2017-03-15 DIAGNOSIS — M5137 Other intervertebral disc degeneration, lumbosacral region: Secondary | ICD-10-CM | POA: Diagnosis not present

## 2017-03-15 DIAGNOSIS — M9903 Segmental and somatic dysfunction of lumbar region: Secondary | ICD-10-CM | POA: Diagnosis not present

## 2017-03-15 DIAGNOSIS — M9901 Segmental and somatic dysfunction of cervical region: Secondary | ICD-10-CM | POA: Diagnosis not present

## 2017-04-06 ENCOUNTER — Encounter: Payer: Self-pay | Admitting: Family Medicine

## 2017-04-07 ENCOUNTER — Encounter: Payer: Self-pay | Admitting: Internal Medicine

## 2017-04-07 ENCOUNTER — Ambulatory Visit (INDEPENDENT_AMBULATORY_CARE_PROVIDER_SITE_OTHER): Payer: BLUE CROSS/BLUE SHIELD | Admitting: Internal Medicine

## 2017-04-07 VITALS — BP 112/62 | HR 82 | Ht 63.5 in | Wt 113.2 lb

## 2017-04-07 DIAGNOSIS — E038 Other specified hypothyroidism: Secondary | ICD-10-CM

## 2017-04-07 DIAGNOSIS — E063 Autoimmune thyroiditis: Secondary | ICD-10-CM

## 2017-04-07 LAB — T4, FREE: Free T4: 0.66 ng/dL (ref 0.60–1.60)

## 2017-04-07 LAB — TSH: TSH: 3.44 u[IU]/mL (ref 0.35–4.50)

## 2017-04-07 LAB — T3, FREE: T3, Free: 3 pg/mL (ref 2.3–4.2)

## 2017-04-07 NOTE — Patient Instructions (Signed)
Please stop at the lab.  Please come back for labs in 6 months and an appt in 1 year.

## 2017-04-07 NOTE — Progress Notes (Signed)
Patient ID: Melissa Johnson, female   DOB: 1970-04-01, 47 y.o.   MRN: [id number]   HPI  Melissa Johnson is a 47 y.o.-year-old female, returning for f/u for Hashimoto's thyroiditis. Last visit 1 year ago.  She had generalized mm pain >> stopped dairy >> pain resolved.  She is very happy with this. Also, hair loss stopped until last 2 weeks >> started MVI + vitamin D.  She is wondering whether her thyroid test may be off.  She also started now to feel irritable and tired.   She continues to be gluten free.  Reviewed and addended history: Pt. has been dx with hypothyroidism in 11/2012 (had hair loss and brittle nails); she started Synthroid DAW then >> felt worse (more hair loss, fatigue) and could not tolerate it: bloating, nausea >> stopped 04/2013.  She would not want to restart it.  8 weeks after she stopped the LT4 >> labs: 09/16/2013: TSH 5.7 (0.34-4.5), free t4 0.55 (0.61-1.12)  Since then, she had fluctuating TFTs, but only minimally abnormal.  I reviewed pt's previous thyroid tests -normal at last 3 checks: Lab Results  Component Value Date   TSH 3.47 10/10/2016   TSH 3.13 04/07/2016   TSH 4.44 10/07/2015   TSH 5.35 (H) 01/30/2015   TSH 6.17 (H) 01/29/2014   TSH 5.82 (H) 12/31/2013   TSH 0.81 10/30/2013   FREET4 0.65 10/10/2016   FREET4 0.55 (L) 04/07/2016   FREET4 0.52 (L) 10/07/2015   FREET4 0.61 01/30/2015   FREET4 0.68 01/29/2014   FREET4 0.60 12/31/2013   FREET4 0.57 (L) 10/30/2013  Previously: 09/16/2013: TSH 5.7 (0.34-4.5), free t4 0.55 (0.61-1.12) 04/29/2013: TSH 1.83, free t4 0.65 01/18/2013: TSH 1.46 11/2012: TSH 6  Thyroid Ab's were positive, but were improving at last check: Component     Latest Ref Rng & Units 10/29/2013 10/07/2015  Thyroperoxidase Ab SerPl-aCnc     <9 IU/mL 437.0 (H) 102 (H)  Thyroglobulin Ab     <40.0 IU/mL <20.0    We discussed about starting LT4 at all of our previous visits >> her preference is to stay off thyroid medication unless  absolutely necessary.  Pt describes: - Fatigue, more in the last 2 weeks - Hair loss, more in the last 2 weeks - Resolved muscle pains - Weight loss after she started to use the elimination diet to troubleshoot her muscle aches - No heat or cold intolerance - + constipation  Pt denies: - feeling nodules in neck - hoarseness - dysphagia - choking - SOB with lying down  ROS: Constitutional: + See HPI Eyes: no blurry vision, no xerophthalmia ENT: no sore throat, + see HPI Cardiovascular: no CP/no SOB/no palpitations/no leg swelling Respiratory: no cough/no SOB/no wheezing Gastrointestinal: no N/no V/no D/+ C/no acid reflux Musculoskeletal: no muscle aches/no joint aches Skin: no rashes, + hair loss Neurological: no tremors/no numbness/no tingling/no dizziness  I reviewed pt's medications, allergies, PMH, social hx, family hx, and changes were documented in the history of present illness. Otherwise, unchanged from my initial visit note.  PE: BP 112/62   Pulse 82   Ht 5' 3.5" (1.613 m)   Wt 113 lb 3.2 oz (51.3 kg)   SpO2 98%   BMI 19.74 kg/m  Body mass index is 19.74 kg/m. Wt Readings from Last 3 Encounters:  04/07/17 113 lb 3.2 oz (51.3 kg)  04/07/16 124 lb (56.2 kg)  02/23/16 120 lb (54.4 kg)   Constitutional: normal weight, in NAD Eyes: PERRLA, EOMI, no exophthalmos  ENT: moist mucous membranes, no thyromegaly, no cervical lymphadenopathy Cardiovascular: RRR, No MRG Respiratory: CTA B Gastrointestinal: abdomen soft, NT, ND, BS+ Musculoskeletal: no deformities, strength intact in all 4 Skin: moist, warm, no rashes Neurological: no tremor with outstretched hands, DTR normal in all 4  ASSESSMENT: 1. Hypothyroidism - mild - Hashimoto's thyroiditis - dx 10/2013 - tried LT4 (Synthroid)   PLAN:  1. Patient with mild Hashimoto's hypothyroidism, not on levothyroxine therapy.  She tried this in the past and could not tolerate it due to bloating and acne.  She would  prefer to remain off thyroid medications if at all possible.  She appears euthyroid and she is very happy that her muscle aches are resolved after she stopped dairy.  In the last 2 weeks she feels more tired and also had some more hair loss and she is wondering whether dietary indiscretions during the holidays may have contributed.  She started a multivitamin and vitamin D. - We will check her TFTs today and we discussed that if they are normal, we may need to use:  Generic levothyroxine  Synthroid 50 mcg as this does not contain excipients  Liquid levothyroxine (Tirosint) - If labs are normal, I will see her back in 1 year, but check again labs in 6 months  - time spent with the patient: 15 minutes of which >50% was spent in obtaining information about her symptoms, reviewing her previous labs, evaluations, and treatments, counseling her about her condition (please see the discussed topics above), and developing a plan to further investigate it; she had a number of questions which I addressed.  Office Visit on 04/07/2017  Component Date Value Ref Range Status  . TSH 04/07/2017 3.44  0.35 - 4.50 uIU/mL Final  . Free T4 04/07/2017 0.66  0.60 - 1.60 ng/dL Final   Comment: Specimens from patients who are undergoing biotin therapy and /or ingesting biotin supplements may contain high levels of biotin.  The higher biotin concentration in these specimens interferes with this Free T4 assay.  Specimens that contain high levels  of biotin may cause false high results for this Free T4 assay.  Please interpret results in light of the total clinical presentation of the patient.    . T3, Free 04/07/2017 3.0  2.3 - 4.2 pg/mL Final   Normal TFTs >> no need to start thyroid hormones.  Philemon Kingdom, MD PhD [facility] Endocrinology

## 2017-04-13 NOTE — Progress Notes (Signed)
HPI:  Here for CPE:  -Concerns and/or follow up today:  Melissa Johnson is a pleasant 47 year old with a past medical history of mild hyperlipidemia, Hashimoto's, sees Dr. Cruzita Lederer and endocrinology for management, and seasonal allergies here for her physical. In the past she saw Dr. Matthew Saras for her women's health exams. She has a number of things she would like to discuss today as well:  1) chronic fatigue/anxiety/depression: -Ongoing for a number of years -She would like to check a number of lab test that her gynecologist recommended including a CMP, vitamin D, check hemoglobin A1c -This is a generalized feeling of being tired and overwhelmed -She works, has children, still gets out some for fun activities -Is anxious and irritable on most days, mildly depressed intermittently -Has panic attacks occasionally -We talked this up to the thyroid issues -Does not want to take medications for this -He has not been seeing a psychologist, her child sees psychology -Denies thoughts of self-harm, hallucinations, manic symptoms, thoughts of harm to others  2) new rash on the scalp -Started after getting her hair colored about 6 weeks ago -Was a burning pain.  Rash, now not as irritated, but rash still remains in several areas in the occiput region around the ears  3) chronic constipation and bloating: -Chronic, unchanged except for improving some -Elimination diet has helped with this -Still has some constipation from time to time -No reported bleeding, vomiting, unexplained weight loss -She did lose weight intentionally with a new diet   -Diet: variety of foods, balance and well rounded, larger portion sizes -Exercise: no regular exercise -Taking folic acid, vitamin D or calcium: no -Diabetes and Dyslipidemia Screening: Due for labs -Vaccines: see vaccine section EPIC -pap history: just did with Dr. Matthew Saras -FDLMP: see nursing notes -sexual activity: yes, female partner, no new  partners -wants STI testing (Hep C if born [birth year]): no -FH breast, colon or ovarian ca: see FH Last mammogram: Sees GYN Last colon cancer screening: Not applicable Breast Ca Risk Assessment: see family history and pt history DEXA (>/= 65): Not applicable  -Alcohol, Tobacco, drug use: see social history  Review of Systems - no fevers, unintentional weight loss, vision loss, hearing loss, chest pain, sob, hemoptysis, melena, hematochezia, hematuria, genital discharge, changing or concerning skin lesions, bleeding, bruising, loc, thoughts of self harm or SI  Past Medical History:  Diagnosis Date  . Allergy   . Arthritis    OA, DDD  . Chicken pox   . History of UTI   . Hypothyroidism     History reviewed. No pertinent surgical history.  Family History  Problem Relation Age of Onset  . Heart disease Father        CAD  . Heart disease Paternal Grandmother     Social History   Socioeconomic History  . Marital status: Married    Spouse name: None  . Number of children: None  . Years of education: None  . Highest education level: None  Social Needs  . Financial resource strain: None  . Food insecurity - worry: None  . Food insecurity - inability: None  . Transportation needs - medical: None  . Transportation needs - non-medical: None  Occupational History  . None  Tobacco Use  . Smoking status: Former Research scientist (life sciences)  . Smokeless tobacco: Never Used  . Tobacco comment: only in highschool and college  Substance and Sexual Activity  . Alcohol use: Yes    Comment: 1 glass of wine here and there  .  Drug use: No  . Sexual activity: None  Other Topics Concern  . None  Social History Narrative   Work or School: Teaches preschool - part-time      Home Situation: lives with husband and two kids (39 and 34 - 2015)      Spiritual Beliefs: Christian      Lifestyle: no regular exercise; diet is healthy and gluten free              Current Outpatient Medications:  .   fexofenadine (ALLEGRA) 180 MG tablet, Take 180 mg by mouth daily., Disp: , Rfl:  .  fluticasone (FLONASE) 50 MCG/ACT nasal spray, 2 sprays each nostril daily for 1 month, then 1 spray each nostril daily, Disp: 16 g, Rfl: 1 .  Probiotic Product (ALIGN) 4 MG CAPS, Take 1 capsule by mouth daily., Disp: , Rfl:  .  clobetasol (OLUX) 0.05 % topical foam, Apply topically 2 (two) times daily., Disp: 50 g, Rfl: 0  EXAM:  Vitals:   04/14/17 0937  BP: 108/80  Pulse: 65  Temp: 98.4 F (36.9 C)  SpO2: 99%    GENERAL: vitals reviewed and listed below, alert, oriented, appears well hydrated and in no acute distress  HEENT: head atraumatic, PERRLA, normal appearance of eyes, ears, nose and mouth. moist mucus membranes.  NECK: supple, no masses or lymphadenopathy  LUNGS: clear to auscultation bilaterally, no rales, rhonchi or wheeze  CV: HRRR, no peripheral edema or cyanosis, normal pedal pulses  ABDOMEN: bowel sounds normal, soft, non tender to palpation, no masses, no rebound or guarding  GU/BREAST:  Declined, does with GYN  SKIN: no rash or abnormal lesions except for mildly erythematous and scaly skin on the occipital region and bilateral temporal region of the scalp  MS: normal gait, moves all extremities normally  NEURO: normal gait, speech and thought processing grossly intact, muscle tone grossly intact throughout  PSYCH: normal affect, pleasant and cooperative  ASSESSMENT AND PLAN:  Discussed the following assessment and plan:  PREVENTIVE EXAM: -Discussed and advised all [place] preventive services health task force level A and B recommendations for age, sex and risks. -Advised at least 150 minutes of exercise per week and a healthy diet with avoidance of (less then 1 serving per week) processed foods, white starches, red meat, fast foods and sweets and consisting of: * 5-9 servings of fresh fruits and vegetables (not corn or potatoes) *nuts and seeds, beans *olives and olive  oil *lean meats such as fish and white chicken  *whole grains -labs, studies and vaccines per orders this encounter - Hemoglobin A1c - Lipid panel  2. Hashimoto's thyroiditis -Sees endocrinologist for management  3. Dyslipidemia -Check labs, advised healthy diet and regular aerobic exercise  4. Chronic fatigue -we discussed possible serious and likely etiologies, workup and treatment, treatment risks and return precautions -after this discussion, Editha opted for:  - Vitamin B12  - VITAMIN D 25 Hydroxy (Vit-D Deficiency, Fractures)  - CBC with Differential/Platelet  - Comprehensive metabolic panel  -consideration tx anxiety depression -follow up advised in 3 months if not improving -of course, we advised Rick  to return or notify a doctor immediately if symptoms worsen or persist or new concerns arise.   5. Depression, recurrent (HCC) -mild -PHQ9 -discussed tx options and risks -she is considering CBT - info provided -declined medications at this time -advised f/u in 3 months or sooner if worsening   6. Anxiety -see plan for depression  7. Constipation, unspecified constipation type -  discussed etiologies -trial benefiber daily with mirilax prn -f/u advised if any issues persist  8. Dermatitis -no more hair color or discuss with hair dresser -olux -follow up if persist   Patient advised to return to clinic immediately if symptoms worsen or persist or new concerns.  Patient Instructions  BEFORE YOU LEAVE: -Phq9 - enter into epic -labs -follow up: yearly and in 3 months if any persistent symptoms  We have ordered labs or studies at this visit. It can take up to 1-2 weeks for results and processing. IF results require follow up or explanation, we will call you with instructions. Clinically stable results will be released to your [facility]. If you have not heard from [place] or cannot find your results in [facility] in 2 weeks please contact our office at [phone].  If  you are not yet signed up for [facility], please consider signing up.  Please consider CBT for the irritability and anxiety - see brochure provided.  Try benefiber every morning and follow up if symptoms persist.  I sent the foam for the scalp. Avoid hair color if irritated and discuss with your hair dresser.   We recommend the following healthy lifestyle for LIFE: 1) Small portions. But, make sure to get regular (at least 3 per day), healthy meals and small healthy snacks if needed.  2) Eat a healthy clean diet.   TRY TO EAT: -at least 5-7 servings of low sugar, colorful, and nutrient rich vegetables per day (not corn, potatoes or bananas.) -berries are the best choice if you wish to eat fruit (only eat small amounts if trying to reduce weight)  -lean meets (fish, white meat of chicken or [place]) -vegan proteins for some meals - beans or tofu, whole grains, nuts and seeds -Replace bad fats with good fats - good fats include: fish, nuts and seeds, canola oil, olive oil -small amounts of low fat or non fat dairy -small amounts of100 % whole grains - check the lables -drink plenty of water  AVOID: -SUGAR, sweets, anything with added sugar, corn syrup or sweeteners - must read labels as even foods advertised as "healthy" often are loaded with sugar -if you must have a sweetener, small amounts of stevia may be best -sweetened beverages and artificially sweetened beverages -simple starches (rice, bread, potatoes, pasta, chips, etc - small amounts of 100% whole grains are ok) -red meat, pork, butter -fried foods, fast food, processed food, excessive dairy, eggs and coconut.  3)Get at least 150 minutes of sweaty aerobic exercise per week.  4)Reduce stress - consider counseling, meditation and relaxation to balance other aspects of your life.    Preventive Care 40-64 Years, Female Preventive care refers to lifestyle choices and visits with your health care provider that can promote health  and wellness. What does preventive care include?  A yearly physical exam. This is also called an annual well check.  Dental exams once or twice a year.  Routine eye exams. Ask your health care provider how often you should have your eyes checked.  Personal lifestyle choices, including: ? Daily care of your teeth and gums. ? Regular physical activity. ? Eating a healthy diet. ? Avoiding tobacco and drug use. ? Limiting alcohol use. ? Practicing safe sex. ? Taking low-dose aspirin daily starting at age 65. ? Taking vitamin and mineral supplements as recommended by your health care provider. What happens during an annual well check? The services and screenings done by your health care provider during your annual well  check will depend on your age, overall health, lifestyle risk factors, and family history of disease. Counseling Your health care provider may ask you questions about your:  Alcohol use.  Tobacco use.  Drug use.  Emotional well-being.  Home and relationship well-being.  Sexual activity.  Eating habits.  Work and work Statistician.  Method of birth control.  Menstrual cycle.  Pregnancy history.  Screening You may have the following tests or measurements:  Height, weight, and BMI.  Blood pressure.  Lipid and cholesterol levels. These may be checked every 5 years, or more frequently if you are over 70 years old.  Skin check.  Lung cancer screening. You may have this screening every year starting at age 46 if you have a 30-pack-year history of smoking and currently smoke or have quit within the past 15 years.  Fecal occult blood test (FOBT) of the stool. You may have this test every year starting at age 68.  Flexible sigmoidoscopy or colonoscopy. You may have a sigmoidoscopy every 5 years or a colonoscopy every 10 years starting at age 42.  Hepatitis C blood test.  Hepatitis B blood test.  Sexually transmitted disease (STD) testing.  Diabetes  screening. This is done by checking your blood sugar (glucose) after you have not eaten for a while (fasting). You may have this done every 1-3 years.  Mammogram. This may be done every 1-2 years. Talk to your health care provider about when you should start having regular mammograms. This may depend on whether you have a family history of breast cancer.  BRCA-related cancer screening. This may be done if you have a family history of breast, ovarian, tubal, or peritoneal cancers.  Pelvic exam and Pap test. This may be done every 3 years starting at age 46. Starting at age 54, this may be done every 5 years if you have a Pap test in combination with an HPV test.  Bone density scan. This is done to screen for osteoporosis. You may have this scan if you are at high risk for osteoporosis.  Discuss your test results, treatment options, and if necessary, the need for more tests with your health care provider. Vaccines Your health care provider may recommend certain vaccines, such as:  Influenza vaccine. This is recommended every year.  Tetanus, diphtheria, and acellular pertussis (Tdap, Td) vaccine. You may need a Td booster every 10 years.  Varicella vaccine. You may need this if you have not been vaccinated.  Zoster vaccine. You may need this after age 7.  Measles, mumps, and rubella (MMR) vaccine. You may need at least one dose of MMR if you were born in 1957 or later. You may also need a second dose.  Pneumococcal 13-valent conjugate (PCV13) vaccine. You may need this if you have certain conditions and were not previously vaccinated.  Pneumococcal polysaccharide (PPSV23) vaccine. You may need one or two doses if you smoke cigarettes or if you have certain conditions.  Meningococcal vaccine. You may need this if you have certain conditions.  Hepatitis A vaccine. You may need this if you have certain conditions or if you travel or work in places where you may be exposed to hepatitis  A.  Hepatitis B vaccine. You may need this if you have certain conditions or if you travel or work in places where you may be exposed to hepatitis B.  Haemophilus influenzae type b (Hib) vaccine. You may need this if you have certain conditions.  Talk to your health care provider  about which screenings and vaccines you need and how often you need them. This information is not intended to replace advice given to you by your health care provider. Make sure you discuss any questions you have with your health care provider. Document Released: 04/10/2015 Document Revised: 12/02/2015 Document Reviewed: 01/13/2015 Elsevier Interactive Patient Education  2018 [employer].        No Follow-up on file.  Colin Benton R., DO

## 2017-04-14 ENCOUNTER — Encounter: Payer: Self-pay | Admitting: Family Medicine

## 2017-04-14 ENCOUNTER — Ambulatory Visit (INDEPENDENT_AMBULATORY_CARE_PROVIDER_SITE_OTHER): Payer: BLUE CROSS/BLUE SHIELD | Admitting: Family Medicine

## 2017-04-14 VITALS — BP 108/80 | HR 65 | Temp 98.4°F | Ht 63.0 in | Wt 114.0 lb

## 2017-04-14 DIAGNOSIS — Z0000 Encounter for general adult medical examination without abnormal findings: Secondary | ICD-10-CM | POA: Diagnosis not present

## 2017-04-14 DIAGNOSIS — L309 Dermatitis, unspecified: Secondary | ICD-10-CM | POA: Diagnosis not present

## 2017-04-14 DIAGNOSIS — F339 Major depressive disorder, recurrent, unspecified: Secondary | ICD-10-CM | POA: Diagnosis not present

## 2017-04-14 DIAGNOSIS — K5900 Constipation, unspecified: Secondary | ICD-10-CM | POA: Diagnosis not present

## 2017-04-14 DIAGNOSIS — E063 Autoimmune thyroiditis: Secondary | ICD-10-CM | POA: Diagnosis not present

## 2017-04-14 DIAGNOSIS — E785 Hyperlipidemia, unspecified: Secondary | ICD-10-CM

## 2017-04-14 DIAGNOSIS — R5382 Chronic fatigue, unspecified: Secondary | ICD-10-CM

## 2017-04-14 DIAGNOSIS — F419 Anxiety disorder, unspecified: Secondary | ICD-10-CM

## 2017-04-14 LAB — COMPREHENSIVE METABOLIC PANEL
ALT: 8 U/L (ref 0–35)
AST: 14 U/L (ref 0–37)
Albumin: 4.6 g/dL (ref 3.5–5.2)
Alkaline Phosphatase: 44 U/L (ref 39–117)
BUN: 11 mg/dL (ref 6–23)
CALCIUM: 9.3 mg/dL (ref 8.4–10.5)
CO2: 29 meq/L (ref 19–32)
Chloride: 103 mEq/L (ref 96–112)
Creatinine, Ser: 0.66 mg/dL (ref 0.40–1.20)
GFR: 103.09 mL/min (ref >60.00)
Glucose, Bld: 94 mg/dL (ref 70–99)
Potassium: 4.5 mEq/L (ref 3.5–5.1)
Sodium: 138 mEq/L (ref 135–145)
Total Bilirubin: 1 mg/dL (ref 0.2–1.2)
Total Protein: 6.8 g/dL (ref 6.0–8.3)

## 2017-04-14 LAB — CBC WITH DIFFERENTIAL/PLATELET
BASOS ABS: 0 10*3/uL (ref 0.0–0.1)
Basophils Relative: 0.8 % (ref 0.0–3.0)
EOS ABS: 0.1 10*3/uL (ref 0.0–0.7)
Eosinophils Relative: 2.5 % (ref 0.0–5.0)
HEMATOCRIT: 40.6 % (ref 36.0–46.0)
Hemoglobin: 13.5 g/dL (ref 12.0–15.0)
LYMPHS PCT: 29.7 % (ref 12.0–46.0)
Lymphs Abs: 1.6 10*3/uL (ref 0.7–4.0)
MCHC: 33.3 g/dL (ref 30.0–36.0)
MCV: 87.3 fl (ref 78.0–100.0)
Monocytes Absolute: 0.6 10*3/uL (ref 0.1–1.0)
Monocytes Relative: 10.5 % (ref 3.0–12.0)
NEUTROS ABS: 3.1 10*3/uL (ref 1.4–7.7)
NEUTROS PCT: 56.5 % (ref 43.0–77.0)
PLATELETS: 304 10*3/uL (ref 150.0–400.0)
RBC: 4.65 Mil/uL (ref 3.87–5.11)
RDW: 13.3 % (ref 11.5–15.5)
WBC: 5.5 10*3/uL (ref 4.0–10.5)

## 2017-04-14 LAB — HEMOGLOBIN A1C: Hgb A1c MFr Bld: 5.4 % (ref 4.6–6.5)

## 2017-04-14 LAB — VITAMIN B12: Vitamin B-12: 421 pg/mL (ref 211–911)

## 2017-04-14 LAB — LIPID PANEL
CHOL/HDL RATIO: 3
Cholesterol: 171 mg/dL (ref 0–200)
HDL: 58.8 mg/dL (ref >39.00)
LDL Cholesterol: 101 mg/dL — ABNORMAL HIGH (ref 0–99)
NONHDL: 111.9
Triglycerides: 54 mg/dL (ref 0.0–149.0)
VLDL: 10.8 mg/dL (ref 0.0–40.0)

## 2017-04-14 LAB — VITAMIN D 25 HYDROXY (VIT D DEFICIENCY, FRACTURES): VITD: 31.85 ng/mL (ref 30.00–100.00)

## 2017-04-14 MED ORDER — CLOBETASOL PROPIONATE 0.05 % EX FOAM: Freq: Two times a day (BID) | CUTANEOUS | 0 refills | Status: DC

## 2017-04-14 NOTE — Patient Instructions (Addendum)
BEFORE YOU LEAVE: -Phq9 - enter into epic -labs -follow up: yearly and in 3 months if any persistent symptoms  We have ordered labs or studies at this visit. It can take up to 1-2 weeks for results and processing. IF results require follow up or explanation, we will call you with instructions. Clinically stable results will be released to your [facility]. If you have not heard from [place] or cannot find your results in [facility] in 2 weeks please contact our office at [phone].  If you are not yet signed up for [facility], please consider signing up.  Please consider CBT for the irritability and anxiety - see brochure provided.  Try benefiber every morning and follow up if symptoms persist.  I sent the foam for the scalp. Avoid hair color if irritated and discuss with your hair dresser.   We recommend the following healthy lifestyle for LIFE: 1) Small portions. But, make sure to get regular (at least 3 per day), healthy meals and small healthy snacks if needed.  2) Eat a healthy clean diet.   TRY TO EAT: -at least 5-7 servings of low sugar, colorful, and nutrient rich vegetables per day (not corn, potatoes or bananas.) -berries are the best choice if you wish to eat fruit (only eat small amounts if trying to reduce weight)  -lean meets (fish, white meat of chicken or [place]) -vegan proteins for some meals - beans or tofu, whole grains, nuts and seeds -Replace bad fats with good fats - good fats include: fish, nuts and seeds, canola oil, olive oil -small amounts of low fat or non fat dairy -small amounts of100 % whole grains - check the lables -drink plenty of water  AVOID: -SUGAR, sweets, anything with added sugar, corn syrup or sweeteners - must read labels as even foods advertised as "healthy" often are loaded with sugar -if you must have a sweetener, small amounts of stevia may be best -sweetened beverages and artificially sweetened beverages -simple starches (rice, bread, potatoes,  pasta, chips, etc - small amounts of 100% whole grains are ok) -red meat, pork, butter -fried foods, fast food, processed food, excessive dairy, eggs and coconut.  3)Get at least 150 minutes of sweaty aerobic exercise per week.  4)Reduce stress - consider counseling, meditation and relaxation to balance other aspects of your life.    Preventive Care 40-64 Years, Female Preventive care refers to lifestyle choices and visits with your health care provider that can promote health and wellness. What does preventive care include?  A yearly physical exam. This is also called an annual well check.  Dental exams once or twice a year.  Routine eye exams. Ask your health care provider how often you should have your eyes checked.  Personal lifestyle choices, including: ? Daily care of your teeth and gums. ? Regular physical activity. ? Eating a healthy diet. ? Avoiding tobacco and drug use. ? Limiting alcohol use. ? Practicing safe sex. ? Taking low-dose aspirin daily starting at age 42. ? Taking vitamin and mineral supplements as recommended by your health care provider. What happens during an annual well check? The services and screenings done by your health care provider during your annual well check will depend on your age, overall health, lifestyle risk factors, and family history of disease. Counseling Your health care provider may ask you questions about your:  Alcohol use.  Tobacco use.  Drug use.  Emotional well-being.  Home and relationship well-being.  Sexual activity.  Eating habits.  Work and  work environment.  Method of birth control.  Menstrual cycle.  Pregnancy history.  Screening You may have the following tests or measurements:  Height, weight, and BMI.  Blood pressure.  Lipid and cholesterol levels. These may be checked every 5 years, or more frequently if you are over 4 years old.  Skin check.  Lung cancer screening. You may have this  screening every year starting at age 17 if you have a 30-pack-year history of smoking and currently smoke or have quit within the past 15 years.  Fecal occult blood test (FOBT) of the stool. You may have this test every year starting at age 34.  Flexible sigmoidoscopy or colonoscopy. You may have a sigmoidoscopy every 5 years or a colonoscopy every 10 years starting at age 76.  Hepatitis C blood test.  Hepatitis B blood test.  Sexually transmitted disease (STD) testing.  Diabetes screening. This is done by checking your blood sugar (glucose) after you have not eaten for a while (fasting). You may have this done every 1-3 years.  Mammogram. This may be done every 1-2 years. Talk to your health care provider about when you should start having regular mammograms. This may depend on whether you have a family history of breast cancer.  BRCA-related cancer screening. This may be done if you have a family history of breast, ovarian, tubal, or peritoneal cancers.  Pelvic exam and Pap test. This may be done every 3 years starting at age 61. Starting at age 49, this may be done every 5 years if you have a Pap test in combination with an HPV test.  Bone density scan. This is done to screen for osteoporosis. You may have this scan if you are at high risk for osteoporosis.  Discuss your test results, treatment options, and if necessary, the need for more tests with your health care provider. Vaccines Your health care provider may recommend certain vaccines, such as:  Influenza vaccine. This is recommended every year.  Tetanus, diphtheria, and acellular pertussis (Tdap, Td) vaccine. You may need a Td booster every 10 years.  Varicella vaccine. You may need this if you have not been vaccinated.  Zoster vaccine. You may need this after age 38.  Measles, mumps, and rubella (MMR) vaccine. You may need at least one dose of MMR if you were born in 1957 or later. You may also need a second  dose.  Pneumococcal 13-valent conjugate (PCV13) vaccine. You may need this if you have certain conditions and were not previously vaccinated.  Pneumococcal polysaccharide (PPSV23) vaccine. You may need one or two doses if you smoke cigarettes or if you have certain conditions.  Meningococcal vaccine. You may need this if you have certain conditions.  Hepatitis A vaccine. You may need this if you have certain conditions or if you travel or work in places where you may be exposed to hepatitis A.  Hepatitis B vaccine. You may need this if you have certain conditions or if you travel or work in places where you may be exposed to hepatitis B.  Haemophilus influenzae type b (Hib) vaccine. You may need this if you have certain conditions.  Talk to your health care provider about which screenings and vaccines you need and how often you need them. This information is not intended to replace advice given to you by your health care provider. Make sure you discuss any questions you have with your health care provider. Document Released: 04/10/2015 Document Revised: 12/02/2015 Document Reviewed: 01/13/2015 Elsevier Interactive  Patient Education  [employer].

## 2017-06-18 DIAGNOSIS — B029 Zoster without complications: Secondary | ICD-10-CM | POA: Diagnosis not present

## 2017-06-18 DIAGNOSIS — S39012A Strain of muscle, fascia and tendon of lower back, initial encounter: Secondary | ICD-10-CM | POA: Diagnosis not present

## 2017-06-19 DIAGNOSIS — B0239 Other herpes zoster eye disease: Secondary | ICD-10-CM | POA: Diagnosis not present

## 2017-09-27 ENCOUNTER — Other Ambulatory Visit: Payer: Self-pay | Admitting: Internal Medicine

## 2017-09-27 DIAGNOSIS — E063 Autoimmune thyroiditis: Principal | ICD-10-CM

## 2017-09-27 DIAGNOSIS — E038 Other specified hypothyroidism: Secondary | ICD-10-CM

## 2017-10-04 ENCOUNTER — Other Ambulatory Visit (INDEPENDENT_AMBULATORY_CARE_PROVIDER_SITE_OTHER): Payer: BLUE CROSS/BLUE SHIELD

## 2017-10-04 DIAGNOSIS — E063 Autoimmune thyroiditis: Secondary | ICD-10-CM

## 2017-10-04 DIAGNOSIS — E038 Other specified hypothyroidism: Secondary | ICD-10-CM | POA: Diagnosis not present

## 2017-10-04 LAB — T3, FREE: T3 FREE: 3.7 pg/mL (ref 2.3–4.2)

## 2017-10-04 LAB — TSH: TSH: 5.19 u[IU]/mL — AB (ref 0.35–4.50)

## 2017-10-04 LAB — T4, FREE: FREE T4: 0.61 ng/dL (ref 0.60–1.60)

## 2017-10-16 DIAGNOSIS — D3121 Benign neoplasm of right retina: Secondary | ICD-10-CM | POA: Diagnosis not present

## 2018-03-14 DIAGNOSIS — Z01419 Encounter for gynecological examination (general) (routine) without abnormal findings: Secondary | ICD-10-CM | POA: Diagnosis not present

## 2018-03-14 DIAGNOSIS — Z6823 Body mass index (BMI) 23.0-23.9, adult: Secondary | ICD-10-CM | POA: Diagnosis not present

## 2018-03-14 DIAGNOSIS — Z1231 Encounter for screening mammogram for malignant neoplasm of breast: Secondary | ICD-10-CM | POA: Diagnosis not present

## 2018-04-06 ENCOUNTER — Encounter: Payer: Self-pay | Admitting: Internal Medicine

## 2018-04-06 ENCOUNTER — Ambulatory Visit (INDEPENDENT_AMBULATORY_CARE_PROVIDER_SITE_OTHER): Payer: BLUE CROSS/BLUE SHIELD | Admitting: Internal Medicine

## 2018-04-06 VITALS — BP 118/60 | HR 80 | Ht 63.0 in | Wt 131.0 lb

## 2018-04-06 DIAGNOSIS — E063 Autoimmune thyroiditis: Secondary | ICD-10-CM | POA: Diagnosis not present

## 2018-04-06 DIAGNOSIS — R635 Abnormal weight gain: Secondary | ICD-10-CM

## 2018-04-06 LAB — TSH: TSH: 7.24 u[IU]/mL — ABNORMAL HIGH (ref 0.35–4.50)

## 2018-04-06 LAB — T4, FREE: FREE T4: 0.59 ng/dL — AB (ref 0.60–1.60)

## 2018-04-06 LAB — T3, FREE: T3, Free: 2.6 pg/mL (ref 2.3–4.2)

## 2018-04-06 NOTE — Progress Notes (Signed)
Patient ID: Melissa Johnson, female   DOB: 09-10-1970, 48 y.o.   MRN: [id number]   HPI  Melissa Johnson is a very pleasant 48 y.o.-year-old female, returning for f/u for Hashimoto's thyroiditis. Last visit 1 year ago.  Reviewed and addended history: Pt. has been dx with hypothyroidism in 11/2012 (had hair loss and brittle nails); she started Synthroid DAW then >> felt worse (more hair loss, fatigue) and could not tolerate it: bloating, nausea >> stopped 04/2013.  She would not want to restart it.  8 weeks after she stopped the LT4 >> labs: 09/16/2013: TSH 5.7 (0.34-4.5), free t4 0.55 (0.61-1.12)  Since then, she had fluctuating TFTs, but only minimally abnormal.  Reviewed patient's previous tests: Lab Results  Component Value Date   TSH 5.19 (H) 10/04/2017   TSH 3.44 04/07/2017   TSH 3.47 10/10/2016   TSH 3.13 04/07/2016   TSH 4.44 10/07/2015   TSH 5.35 (H) 01/30/2015   TSH 6.17 (H) 01/29/2014   TSH 5.82 (H) 12/31/2013   TSH 0.81 10/30/2013   FREET4 0.61 10/04/2017   FREET4 0.66 04/07/2017   FREET4 0.65 10/10/2016   FREET4 0.55 (L) 04/07/2016   FREET4 0.52 (L) 10/07/2015   FREET4 0.61 01/30/2015   FREET4 0.68 01/29/2014   FREET4 0.60 12/31/2013   FREET4 0.57 (L) 10/30/2013  Previously: 09/16/2013: TSH 5.7 (0.34-4.5), free t4 0.55 (0.61-1.12) 04/29/2013: TSH 1.83, free t4 0.65 01/18/2013: TSH 1.46 11/2012: TSH 6  Her thyroid antibodies were positive but were improving: Component     Latest Ref Rng & Units 10/29/2013 10/07/2015  Thyroperoxidase Ab SerPl-aCnc     <9 IU/mL 437.0 (H) 102 (H)  Thyroglobulin Ab     <40.0 IU/mL <20.0    We discussed in the past that her hypothyroidism is very mild and her preference is to stay off medications for now.  At last visit she had increased fatigue, hair loss and constipation.  She had weight loss after she started to use the "elimination diet" to help with her muscle aches.  She eliminated dairy with very good results in terms of muscle  aches.  She is also gluten-free.  Now, still fatigue but no hair loss and improved constipation.  However, she did gain weight since last visit.  Pt denies: - feeling nodules in neck - hoarseness - dysphagia - choking - SOB with lying down  ROS: Constitutional: + weight gain/no weight loss, + fatigue, no subjective hyperthermia, no subjective hypothermia Eyes: no blurry vision, + xerophthalmia ENT: no sore throat, + see HPI, + dry mouth Cardiovascular: no CP/no SOB/no palpitations/no leg swelling Respiratory: no cough/no SOB/no wheezing Gastrointestinal: no N/no V/no D/no C/no acid reflux Musculoskeletal: no muscle aches/no joint aches Skin: no rashes, no hair loss Neurological: no tremors/no numbness/no tingling/no dizziness  I reviewed pt's medications, allergies, PMH, social hx, family hx, and changes were documented in the history of present illness. Otherwise, unchanged from my initial visit note. Started collagen.  Past Medical History:  Diagnosis Date  . Allergy   . Arthritis    OA, DDD  . Chicken pox   . History of UTI   . Hypothyroidism    No past surgical history on file. Social History   Socioeconomic History  . Marital status: Married    Spouse name: Not on file  . Number of children: Not on file  . Years of education: Not on file  . Highest education level: Not on file  Occupational History  . Not on file  Social Needs  . Financial resource strain: Not on file  . Food insecurity:    Worry: Not on file    Inability: Not on file  . Transportation needs:    Medical: Not on file    Non-medical: Not on file  Tobacco Use  . Smoking status: Former Research scientist (life sciences)  . Smokeless tobacco: Never Used  . Tobacco comment: only in highschool and college  Substance and Sexual Activity  . Alcohol use: Yes    Comment: 1 glass of wine here and there  . Drug use: No  . Sexual activity: Not on file  Lifestyle  . Physical activity:    Days per week: Not on file     Minutes per session: Not on file  . Stress: Not on file  Relationships  . Social connections:    Talks on phone: Not on file    Gets together: Not on file    Attends religious service: Not on file    Active member of club or organization: Not on file    Attends meetings of clubs or organizations: Not on file    Relationship status: Not on file  . Intimate partner violence:    Fear of current or ex partner: Not on file    Emotionally abused: Not on file    Physically abused: Not on file    Forced sexual activity: Not on file  Other Topics Concern  . Not on file  Social History Narrative   Work or School: Teaches preschool - part-time      Home Situation: lives with husband and two kids (46 and 46 - 2015)      Spiritual Beliefs: Christian      Lifestyle: no regular exercise; diet is healthy and gluten free            Current Outpatient Medications on File Prior to Visit  Medication Sig Dispense Refill  . cholecalciferol (VITAMIN D3) 25 MCG (1000 UT) tablet Take 1,000 Units by mouth daily.    . fexofenadine (ALLEGRA) 180 MG tablet Take 180 mg by mouth daily.    . fluticasone (FLONASE) 50 MCG/ACT nasal spray 2 sprays each nostril daily for 1 month, then 1 spray each nostril daily 16 g 1  . clobetasol (OLUX) 0.05 % topical foam Apply topically 2 (two) times daily. (Patient not taking: Reported on 04/06/2018) 50 g 0  . Probiotic Product (ALIGN) 4 MG CAPS Take 1 capsule by mouth daily.     No current facility-administered medications on file prior to visit.    No Known Allergies Family History  Problem Relation Age of Onset  . Heart disease Father        CAD  . Heart disease Paternal Grandmother     PE: BP 118/60   Pulse 80   Ht 5' 3"  (1.6 m) Comment: measured  Wt 131 lb (59.4 kg)   SpO2 98%   BMI 23.21 kg/m  Body mass index is 23.21 kg/m. Wt Readings from Last 3 Encounters:  04/06/18 131 lb (59.4 kg)  04/14/17 114 lb (51.7 kg)  04/07/17 113 lb 3.2 oz (51.3 kg)    Constitutional: Normal weight, in NAD Eyes: PERRLA, EOMI, no exophthalmos ENT: moist mucous membranes, no thyromegaly, no cervical lymphadenopathy Cardiovascular: RRR, No MRG Respiratory: CTA B Gastrointestinal: abdomen soft, NT, ND, BS+ Musculoskeletal: no deformities, strength intact in all 4 Skin: moist, warm, no rashes Neurological: no tremor with outstretched hands, DTR normal in all 4  ASSESSMENT: 1.  Hashimoto  thyroiditis  2.  Weight gain  PLAN:  1. Patient with mild Hashimoto's hypothyroidism, not on levothyroxine therapy.  She tried this in the past and could not tolerate it due to bloating and acne.  She would prefer to remain off the medication if possible.  She appears euthyroid and continues to do very well with her diet.  Her muscle aches resolved after she stopped dairy.  She did feel more tired and had more hair loss at last visit.  She started a multivitamin and vitamin D at that time. -We discussed that if she absolutely needs thyroid hormones in the future, we can use:  Generic levothyroxine at a low dose  Synthroid 50 mcg tablets  Liquid levothyroxine (Tirosint). -We will recheck her TFTs today and will also check her antibodies again to check for the activity of her Hashimoto's thyroiditis -At last check, 6 months ago, TSH was slightly high, but her free thyroid hormones were normal, so we discussed to just follow her without medicines for them -If labs are normal today, we will have her back for another set of labs in 6 months and then another visit in a year.  2.  Weight gain -She continues to be dairy and gluten-free.  She gained approximately 15 pounds since last visit. -We discussed about the need to continue to cleanse her diet of things that may cause her weight gain -Also, I recommended to start an exercise routine  Office Visit on 04/06/2018  Component Date Value Ref Range Status  . TSH 04/06/2018 7.24* 0.35 - 4.50 uIU/mL Final  . Free T4  04/06/2018 0.59* 0.60 - 1.60 ng/dL Final   Comment: Specimens from patients who are undergoing biotin therapy and /or ingesting biotin supplements may contain high levels of biotin.  The higher biotin concentration in these specimens interferes with this Free T4 assay.  Specimens that contain high levels  of biotin may cause false high results for this Free T4 assay.  Please interpret results in light of the total clinical presentation of the patient.    . T3, Free 04/06/2018 2.6  2.3 - 4.2 pg/mL Final   TSH higher and free T4 slightly  low.  At this point, I would suggest treatment with 25 mcg levothyroxine.  We can use half of a levothyroxine 50 mcg tablets.  These do not have excipients in a less likely to cause intolerance.  We will check with the patient.  Viewed by Melissa Johnson on 04/11/2018 6:02 PM  Written by Melissa Kingdom, Melissa on 04/06/2018 12:49 PM  Dear Melissa Johnson,  TSH appears to be higher and free T4 is now slightly low. At this point, I would suggest treatment with 25 mcg levothyroxine. We can use half of a levothyroxine 50 mcg tablet daily. These 50 mcg tablets do not have excipients and are less likely to cause intolerance. Please let me know if you want to try this. I would still want to repeat your test in approximately 1 month and a half even if you do not start the medication.  Sincerely,  Melissa Kingdom Melissa   Melissa Johnson, Melissa Johnson  to Me     04/11/18 11:37 AM  Melissa Johnson  I have made a follow up lab appt in 6 weeks. I will wait to see those results before I make a decision about medicine. What would you consider a critical number for my thyroid for me to start medication? I am trying to avoid medicine at all costs but  will obviously start medicine if completely necessary.   Thank you!  Melissa Johnson   Me  to Melissa Johnson, Melissa Johnson    04/11/18 4:38 PM  Dear Melissa Johnson,  That is ok.  A TSH of 10 with normal free hormones is a definite indication for treatment.  Otherwise, if  the free hormones are low, a TSH even slightly above normal range is an indication for treatment.  Sincerely,  Melissa Kingdom Melissa   Last read by Melissa Johnson at 5:31 PM on 04/11/2018.    Melissa Kingdom, Melissa Johnson [facility]

## 2018-04-06 NOTE — Patient Instructions (Signed)
Please stop at the lab.  Please come back for labs in 6 months and an appt in 1 year.

## 2018-04-11 ENCOUNTER — Encounter: Payer: Self-pay | Admitting: Internal Medicine

## 2018-04-15 NOTE — Progress Notes (Signed)
HPI:  Using dictation device. Unfortunately this device frequently misinterprets words/phrases.  Here for CPE:  -Concerns and/or follow up today: none   Chronic medical problems summarized below were reviewed for changes.   PMH Hypothyroidism - sees endocrinologist for management, Dr. Cruzita Lederer. Chronically with fatigue, and mild anxiety and depression. TSH a little elevated recent check. Hx of very mildly elevated lipids. Reports she is trying dietary changes for the hypothyroidism and plans a close follow up with endo for recheck.   -Diet: variety of foods, balance and well rounded -Exercise: no regular exercise -Taking folic acid, vitamin D or calcium: no -Diabetes and Dyslipidemia Screening: done, hx mildly elevated lipids. -Vaccines: see vaccine section EPIC -pap history: Sees gyn, Dr. Matthew Saras - reports seen recently for annual exam -FDLMP: see nursing notes -wants STI testing (Hep C if born [birth year]): no -FH breast, colon or ovarian ca: see FH Last mammogram: sees gyn for women's health Last colon cancer screening: n/a Breast Ca Risk Assessment: see family history and pt history DEXA (>/= 14): na  -Alcohol, Tobacco, drug use: see social history  Review of Systems - no reported fevers, unintentional weight loss, vision loss, hearing loss, chest pain, sob, hemoptysis, melena, hematochezia, hematuria, genital discharge, changing or concerning skin lesions, bleeding, bruising, loc, thoughts of self harm or SI  Past Medical History:  Diagnosis Date  . Allergy   . Arthritis    OA, DDD  . Chicken pox   . History of UTI   . Hypothyroidism     History reviewed. No pertinent surgical history.  Family History  Problem Relation Age of Onset  . Heart disease Father        CAD  . Heart disease Paternal Grandmother     Social History   Socioeconomic History  . Marital status: Married    Spouse name: Not on file  . Number of children: Not on file  . Years of education: Not  on file  . Highest education level: Not on file  Occupational History  . Not on file  Social Needs  . Financial resource strain: Not on file  . Food insecurity:    Worry: Not on file    Inability: Not on file  . Transportation needs:    Medical: Not on file    Non-medical: Not on file  Tobacco Use  . Smoking status: Former Research scientist (life sciences)  . Smokeless tobacco: Never Used  . Tobacco comment: only in highschool and college  Substance and Sexual Activity  . Alcohol use: Yes    Comment: 1 glass of wine here and there  . Drug use: No  . Sexual activity: Not on file  Lifestyle  . Physical activity:    Days per week: Not on file    Minutes per session: Not on file  . Stress: Not on file  Relationships  . Social connections:    Talks on phone: Not on file    Gets together: Not on file    Attends religious service: Not on file    Active member of club or organization: Not on file    Attends meetings of clubs or organizations: Not on file    Relationship status: Not on file  Other Topics Concern  . Not on file  Social History Narrative   Work or School: Teaches preschool - part-time      Home Situation: lives with husband and two kids (65 and 21 - 2015)      Spiritual Beliefs: Darrick Meigs  Lifestyle: no regular exercise; diet is healthy and gluten free              Current Outpatient Medications:  .  cholecalciferol (VITAMIN D3) 25 MCG (1000 UT) tablet, Take 1,000 Units by mouth daily., Disp: , Rfl:  .  clobetasol (OLUX) 0.05 % topical foam, Apply topically 2 (two) times daily., Disp: 50 g, Rfl: 0 .  fexofenadine (ALLEGRA) 180 MG tablet, Take 180 mg by mouth daily., Disp: , Rfl:  .  fluticasone (FLONASE) 50 MCG/ACT nasal spray, 2 sprays each nostril daily for 1 month, then 1 spray each nostril daily, Disp: 16 g, Rfl: 1 .  Probiotic Product (ALIGN) 4 MG CAPS, Take 1 capsule by mouth daily., Disp: , Rfl:   EXAM:  Vitals:   04/17/18 0818  BP: 102/76  Pulse: 80  Temp: 98.3  F (36.8 C)    GENERAL: vitals reviewed and listed below, alert, oriented, appears well hydrated and in no acute distress  HEENT: head atraumatic, PERRLA, normal appearance of eyes, ears, nose and mouth. moist mucus membranes.  NECK: supple, no masses or lymphadenopathy  LUNGS: clear to auscultation bilaterally, no rales, rhonchi or wheeze  CV: HRRR, no peripheral edema or cyanosis, normal pedal pulses  ABDOMEN: bowel sounds normal, soft, non tender to palpation, no masses, no rebound or guarding  GU/BREAST: declined, does with gyn  SKIN: no rash or abnormal lesions on exposed portions, she declined full skin exam  MS: normal gait, moves all extremities normally  NEURO: normal gait, speech and thought processing grossly intact, muscle tone grossly intact throughout  PSYCH: normal affect, pleasant and cooperative  ASSESSMENT AND PLAN:  Discussed the following assessment and plan:  PREVENTIVE EXAM: -Discussed and advised all [place] preventive services health task force level A and B recommendations for age, sex and risks. -Advised at least 150 minutes of exercise per week and a healthy diet  -labs, studies and vaccines per orders this encounter -declined flu vaccine -agreed to tetanus booster   Patient advised to return to clinic immediately if symptoms worsen or persist or new concerns.  Patient Instructions  BEFORE YOU LEAVE: -tetanus booster -follow up: yearly for physical  Please add in 150 minutes of aerobic exercise per week.   Preventive Care 40-64 Years, Female Preventive care refers to lifestyle choices and visits with your health care provider that can promote health and wellness. What does preventive care include?   A yearly physical exam. This is also called an annual well check.  Dental exams once or twice a year.  Routine eye exams. Ask your health care provider how often you should have your eyes checked.  Personal lifestyle choices,  including: ? Daily care of your teeth and gums. ? Regular physical activity. ? Eating a healthy diet. ? Avoiding tobacco and drug use. ? Limiting alcohol use. ? Practicing safe sex. ? Taking vitamin and mineral supplements as recommended by your health care provider. What happens during an annual well check? The services and screenings done by your health care provider during your annual well check will depend on your age, overall health, lifestyle risk factors, and family history of disease. Counseling Your health care provider may ask you questions about your:  Alcohol use.  Tobacco use.  Drug use.  Emotional well-being.  Home and relationship well-being.  Sexual activity.  Eating habits.  Work and work Statistician.  Method of birth control.  Menstrual cycle.  Pregnancy history. Screening You may have the following tests or  measurements:  Height, weight, and BMI.  Blood pressure.  Lipid and cholesterol levels. These may be checked every 5 years, or more frequently if you are over 15 years old.  Skin check.  Lung cancer screening. You may have this screening every year starting at age 37 if you have a 30-pack-year history of smoking and currently smoke or have quit within the past 15 years.  Colorectal cancer screening. All adults should have this screening starting at age 81 and continuing until age 82. Your health care provider may recommend screening at age 7. You will have tests every 1-10 years, depending on your results and the type of screening test. People at increased risk should start screening at an earlier age. Screening tests may include: ? Guaiac-based fecal occult blood testing. ? Fecal immunochemical test (FIT). ? Stool DNA test. ? Virtual colonoscopy. ? Sigmoidoscopy. During this test, a flexible tube with a tiny camera (sigmoidoscope) is used to examine your rectum and lower colon. The sigmoidoscope is inserted through your anus into your rectum  and lower colon. ? Colonoscopy. During this test, a long, thin, flexible tube with a tiny camera (colonoscope) is used to examine your entire colon and rectum.  Hepatitis C blood test.  Hepatitis B blood test.  Sexually transmitted disease (STD) testing.  Diabetes screening. This is done by checking your blood sugar (glucose) after you have not eaten for a while (fasting). You may have this done every 1-3 years.  Mammogram. This may be done every 1-2 years. Talk to your health care provider about when you should start having regular mammograms. This may depend on whether you have a family history of breast cancer.  BRCA-related cancer screening. This may be done if you have a family history of breast, ovarian, tubal, or peritoneal cancers.  Pelvic exam and Pap test. This may be done every 3 years starting at age 72. Starting at age 86, this may be done every 5 years if you have a Pap test in combination with an HPV test.  Bone density scan. This is done to screen for osteoporosis. You may have this scan if you are at high risk for osteoporosis. Discuss your test results, treatment options, and if necessary, the need for more tests with your health care provider. Vaccines Your health care provider may recommend certain vaccines, such as:  Influenza vaccine. This is recommended every year.  Tetanus, diphtheria, and acellular pertussis (Tdap, Td) vaccine. You may need a Td booster every 10 years.  Varicella vaccine. You may need this if you have not been vaccinated.  Zoster vaccine. You may need this after age 71.  Measles, mumps, and rubella (MMR) vaccine. You may need at least one dose of MMR if you were born in 1957 or later. You may also need a second dose.  Pneumococcal 13-valent conjugate (PCV13) vaccine. You may need this if you have certain conditions and were not previously vaccinated.  Pneumococcal polysaccharide (PPSV23) vaccine. You may need one or two doses if you smoke  cigarettes or if you have certain conditions.  Meningococcal vaccine. You may need this if you have certain conditions.  Hepatitis A vaccine. You may need this if you have certain conditions or if you travel or work in places where you may be exposed to hepatitis A.  Hepatitis B vaccine. You may need this if you have certain conditions or if you travel or work in places where you may be exposed to hepatitis B.  Haemophilus influenzae type  b (Hib) vaccine. You may need this if you have certain conditions. Talk to your health care provider about which screenings and vaccines you need and how often you need them. This information is not intended to replace advice given to you by your health care provider. Make sure you discuss any questions you have with your health care provider. Document Released: 04/10/2015 Document Revised: 05/04/2017 Document Reviewed: 01/13/2015 Elsevier Interactive Patient Education  2019 [employer].    No follow-ups on file.  Melissa Kern, DO

## 2018-04-17 ENCOUNTER — Ambulatory Visit (INDEPENDENT_AMBULATORY_CARE_PROVIDER_SITE_OTHER): Payer: BLUE CROSS/BLUE SHIELD | Admitting: Family Medicine

## 2018-04-17 ENCOUNTER — Encounter: Payer: Self-pay | Admitting: Family Medicine

## 2018-04-17 VITALS — BP 102/76 | HR 80 | Temp 98.3°F | Ht 62.0 in | Wt 126.7 lb

## 2018-04-17 DIAGNOSIS — E038 Other specified hypothyroidism: Secondary | ICD-10-CM | POA: Diagnosis not present

## 2018-04-17 DIAGNOSIS — Z0000 Encounter for general adult medical examination without abnormal findings: Secondary | ICD-10-CM | POA: Diagnosis not present

## 2018-04-17 DIAGNOSIS — Z23 Encounter for immunization: Secondary | ICD-10-CM | POA: Diagnosis not present

## 2018-04-17 DIAGNOSIS — E063 Autoimmune thyroiditis: Secondary | ICD-10-CM | POA: Diagnosis not present

## 2018-04-17 NOTE — Patient Instructions (Addendum)
BEFORE YOU LEAVE: -tetanus booster -follow up: yearly for physical  Please add in 46 minutes of aerobic exercise per week.   Preventive Care 46-64 Years, Female Preventive care refers to lifestyle choices and visits with your health care provider that can promote health and wellness. What does preventive care include?   A yearly physical exam. This is also called an annual well check.  Dental exams once or twice a year.  Routine eye exams. Ask your health care provider how often you should have your eyes checked.  Personal lifestyle choices, including: ? Daily care of your teeth and gums. ? Regular physical activity. ? Eating a healthy diet. ? Avoiding tobacco and drug use. ? Limiting alcohol use. ? Practicing safe sex. ? Taking vitamin and mineral supplements as recommended by your health care provider. What happens during an annual well check? The services and screenings done by your health care provider during your annual well check will depend on your age, overall health, lifestyle risk factors, and family history of disease. Counseling Your health care provider may ask you questions about your:  Alcohol use.  Tobacco use.  Drug use.  Emotional well-being.  Home and relationship well-being.  Sexual activity.  Eating habits.  Work and work Statistician.  Method of birth control.  Menstrual cycle.  Pregnancy history. Screening You may have the following tests or measurements:  Height, weight, and BMI.  Blood pressure.  Lipid and cholesterol levels. These may be checked every 5 years, or more frequently if you are over 42 years old.  Skin check.  Lung cancer screening. You may have this screening every year starting at age 46 if you have a 30-pack-year history of smoking and currently smoke or have quit within the past 15 years.  Colorectal cancer screening. All adults should have this screening starting at age 46 and continuing until age 64. Your  health care provider may recommend screening at age 46. You will have tests every 1-10 years, depending on your results and the type of screening test. People at increased risk should start screening at an earlier age. Screening tests may include: ? Guaiac-based fecal occult blood testing. ? Fecal immunochemical test (FIT). ? Stool DNA test. ? Virtual colonoscopy. ? Sigmoidoscopy. During this test, a flexible tube with a tiny camera (sigmoidoscope) is used to examine your rectum and lower colon. The sigmoidoscope is inserted through your anus into your rectum and lower colon. ? Colonoscopy. During this test, a long, thin, flexible tube with a tiny camera (colonoscope) is used to examine your entire colon and rectum.  Hepatitis C blood test.  Hepatitis B blood test.  Sexually transmitted disease (STD) testing.  Diabetes screening. This is done by checking your blood sugar (glucose) after you have not eaten for a while (fasting). You may have this done every 1-3 years.  Mammogram. This may be done every 1-2 years. Talk to your health care provider about when you should start having regular mammograms. This may depend on whether you have a family history of breast cancer.  BRCA-related cancer screening. This may be done if you have a family history of breast, ovarian, tubal, or peritoneal cancers.  Pelvic exam and Pap test. This may be done every 3 years starting at age 46. Starting at age 46, this may be done every 5 years if you have a Pap test in combination with an HPV test.  Bone density scan. This is done to screen for osteoporosis. You may have this scan  if you are at high risk for osteoporosis. Discuss your test results, treatment options, and if necessary, the need for more tests with your health care provider. Vaccines Your health care provider may recommend certain vaccines, such as:  Influenza vaccine. This is recommended every year.  Tetanus, diphtheria, and acellular pertussis  (Tdap, Td) vaccine. You may need a Td booster every 10 years.  Varicella vaccine. You may need this if you have not been vaccinated.  Zoster vaccine. You may need this after age 46.  Measles, mumps, and rubella (MMR) vaccine. You may need at least one dose of MMR if you were born in 1957 or later. You may also need a second dose.  Pneumococcal 13-valent conjugate (PCV13) vaccine. You may need this if you have certain conditions and were not previously vaccinated.  Pneumococcal polysaccharide (PPSV23) vaccine. You may need one or two doses if you smoke cigarettes or if you have certain conditions.  Meningococcal vaccine. You may need this if you have certain conditions.  Hepatitis A vaccine. You may need this if you have certain conditions or if you travel or work in places where you may be exposed to hepatitis A.  Hepatitis B vaccine. You may need this if you have certain conditions or if you travel or work in places where you may be exposed to hepatitis B.  Haemophilus influenzae type b (Hib) vaccine. You may need this if you have certain conditions. Talk to your health care provider about which screenings and vaccines you need and how often you need them. This information is not intended to replace advice given to you by your health care provider. Make sure you discuss any questions you have with your health care provider. Document Released: 04/10/2015 Document Revised: 05/04/2017 Document Reviewed: 01/13/2015 Elsevier Interactive Patient Education  2019 [employer].

## 2018-04-17 NOTE — Addendum Note (Signed)
Addended by: Agnes Lawrence on: 04/17/2018 09:06 AM   Modules accepted: Orders

## 2018-04-27 DIAGNOSIS — N390 Urinary tract infection, site not specified: Secondary | ICD-10-CM | POA: Diagnosis not present

## 2018-05-22 ENCOUNTER — Other Ambulatory Visit: Payer: Self-pay | Admitting: Internal Medicine

## 2018-05-22 DIAGNOSIS — E038 Other specified hypothyroidism: Secondary | ICD-10-CM

## 2018-05-22 DIAGNOSIS — E063 Autoimmune thyroiditis: Principal | ICD-10-CM

## 2018-05-23 ENCOUNTER — Other Ambulatory Visit (INDEPENDENT_AMBULATORY_CARE_PROVIDER_SITE_OTHER): Payer: BLUE CROSS/BLUE SHIELD

## 2018-05-23 DIAGNOSIS — E038 Other specified hypothyroidism: Secondary | ICD-10-CM

## 2018-05-23 DIAGNOSIS — E063 Autoimmune thyroiditis: Secondary | ICD-10-CM | POA: Diagnosis not present

## 2018-05-24 LAB — TSH: TSH: 9.86 u[IU]/mL — ABNORMAL HIGH (ref 0.35–4.50)

## 2018-05-24 LAB — T4, FREE: Free T4: 0.57 ng/dL — ABNORMAL LOW (ref 0.60–1.60)

## 2018-05-25 ENCOUNTER — Other Ambulatory Visit: Payer: Self-pay | Admitting: Internal Medicine

## 2018-05-25 ENCOUNTER — Telehealth: Payer: Self-pay | Admitting: Internal Medicine

## 2018-05-25 ENCOUNTER — Encounter: Payer: Self-pay | Admitting: Internal Medicine

## 2018-05-25 DIAGNOSIS — E063 Autoimmune thyroiditis: Principal | ICD-10-CM

## 2018-05-25 DIAGNOSIS — E038 Other specified hypothyroidism: Secondary | ICD-10-CM

## 2018-05-25 MED ORDER — LEVOTHYROXINE SODIUM 50 MCG PO TABS: 25.0000 ug | ORAL_TABLET | Freq: Every day | ORAL | 3 refills | Status: DC

## 2018-05-25 NOTE — Telephone Encounter (Signed)
See MyChart messages.

## 2018-05-25 NOTE — Telephone Encounter (Signed)
Patient is calling in regards to her lab results.  Please Advise, Thanks

## 2018-06-08 DIAGNOSIS — R300 Dysuria: Secondary | ICD-10-CM | POA: Diagnosis not present

## 2018-07-18 ENCOUNTER — Other Ambulatory Visit (INDEPENDENT_AMBULATORY_CARE_PROVIDER_SITE_OTHER): Payer: BLUE CROSS/BLUE SHIELD

## 2018-07-18 ENCOUNTER — Other Ambulatory Visit: Payer: Self-pay

## 2018-07-18 DIAGNOSIS — E038 Other specified hypothyroidism: Secondary | ICD-10-CM

## 2018-07-18 DIAGNOSIS — E063 Autoimmune thyroiditis: Secondary | ICD-10-CM

## 2018-07-19 ENCOUNTER — Other Ambulatory Visit: Payer: Self-pay | Admitting: Internal Medicine

## 2018-07-19 DIAGNOSIS — E063 Autoimmune thyroiditis: Principal | ICD-10-CM

## 2018-07-19 DIAGNOSIS — E038 Other specified hypothyroidism: Secondary | ICD-10-CM

## 2018-07-19 LAB — TSH: TSH: 5.24 u[IU]/mL — ABNORMAL HIGH (ref 0.35–4.50)

## 2018-07-19 LAB — T4, FREE: Free T4: 0.65 ng/dL (ref 0.60–1.60)

## 2018-07-19 MED ORDER — LEVOTHYROXINE SODIUM 50 MCG PO TABS: 50.0000 ug | ORAL_TABLET | Freq: Every day | ORAL | 3 refills | Status: DC

## 2018-07-20 ENCOUNTER — Encounter: Payer: Self-pay | Admitting: Internal Medicine

## 2018-07-20 ENCOUNTER — Other Ambulatory Visit: Payer: Self-pay | Admitting: Internal Medicine

## 2018-07-20 DIAGNOSIS — M2550 Pain in unspecified joint: Secondary | ICD-10-CM | POA: Diagnosis not present

## 2018-07-20 DIAGNOSIS — E038 Other specified hypothyroidism: Secondary | ICD-10-CM

## 2018-07-20 DIAGNOSIS — E039 Hypothyroidism, unspecified: Secondary | ICD-10-CM | POA: Diagnosis not present

## 2018-07-20 DIAGNOSIS — R42 Dizziness and giddiness: Secondary | ICD-10-CM | POA: Diagnosis not present

## 2018-07-20 DIAGNOSIS — E060 Acute thyroiditis: Secondary | ICD-10-CM | POA: Diagnosis not present

## 2018-07-20 DIAGNOSIS — E063 Autoimmune thyroiditis: Principal | ICD-10-CM

## 2018-07-20 MED ORDER — LEVOTHYROXINE SODIUM 50 MCG PO TABS: 50.0000 ug | ORAL_TABLET | Freq: Every day | ORAL | 3 refills | Status: DC

## 2018-08-14 DIAGNOSIS — E039 Hypothyroidism, unspecified: Secondary | ICD-10-CM | POA: Diagnosis not present

## 2018-08-14 DIAGNOSIS — M2550 Pain in unspecified joint: Secondary | ICD-10-CM | POA: Diagnosis not present

## 2018-08-14 DIAGNOSIS — E063 Autoimmune thyroiditis: Secondary | ICD-10-CM | POA: Diagnosis not present

## 2018-08-14 DIAGNOSIS — Z808 Family history of malignant neoplasm of other organs or systems: Secondary | ICD-10-CM | POA: Diagnosis not present

## 2018-08-14 DIAGNOSIS — Z9109 Other allergy status, other than to drugs and biological substances: Secondary | ICD-10-CM | POA: Diagnosis not present

## 2018-08-14 DIAGNOSIS — R42 Dizziness and giddiness: Secondary | ICD-10-CM | POA: Diagnosis not present

## 2018-10-09 ENCOUNTER — Other Ambulatory Visit: Payer: BLUE CROSS/BLUE SHIELD

## 2018-10-26 DIAGNOSIS — E039 Hypothyroidism, unspecified: Secondary | ICD-10-CM | POA: Diagnosis not present

## 2018-10-26 DIAGNOSIS — E063 Autoimmune thyroiditis: Secondary | ICD-10-CM | POA: Diagnosis not present

## 2018-11-13 DIAGNOSIS — Z9109 Other allergy status, other than to drugs and biological substances: Secondary | ICD-10-CM | POA: Diagnosis not present

## 2018-11-13 DIAGNOSIS — M2550 Pain in unspecified joint: Secondary | ICD-10-CM | POA: Diagnosis not present

## 2018-11-13 DIAGNOSIS — R42 Dizziness and giddiness: Secondary | ICD-10-CM | POA: Diagnosis not present

## 2018-11-13 DIAGNOSIS — E063 Autoimmune thyroiditis: Secondary | ICD-10-CM | POA: Diagnosis not present

## 2019-02-14 DIAGNOSIS — R5383 Other fatigue: Secondary | ICD-10-CM | POA: Diagnosis not present

## 2019-02-14 DIAGNOSIS — M2550 Pain in unspecified joint: Secondary | ICD-10-CM | POA: Diagnosis not present

## 2019-02-14 DIAGNOSIS — E063 Autoimmune thyroiditis: Secondary | ICD-10-CM | POA: Diagnosis not present

## 2019-02-14 DIAGNOSIS — E039 Hypothyroidism, unspecified: Secondary | ICD-10-CM | POA: Diagnosis not present

## 2019-02-27 DIAGNOSIS — E063 Autoimmune thyroiditis: Secondary | ICD-10-CM | POA: Diagnosis not present

## 2019-02-27 DIAGNOSIS — H65499 Other chronic nonsuppurative otitis media, unspecified ear: Secondary | ICD-10-CM | POA: Diagnosis not present

## 2019-02-27 DIAGNOSIS — R5383 Other fatigue: Secondary | ICD-10-CM | POA: Diagnosis not present

## 2019-02-27 DIAGNOSIS — E039 Hypothyroidism, unspecified: Secondary | ICD-10-CM | POA: Diagnosis not present

## 2019-03-19 ENCOUNTER — Telehealth: Payer: Self-pay | Admitting: Internal Medicine

## 2019-03-19 NOTE — Telephone Encounter (Signed)
Noted  

## 2019-03-19 NOTE — Telephone Encounter (Signed)
Patient cancelled 1 year follow up because patient is now being treated by a different Doctor.

## 2019-04-09 ENCOUNTER — Ambulatory Visit: Payer: BLUE CROSS/BLUE SHIELD | Admitting: Internal Medicine

## 2019-04-22 ENCOUNTER — Encounter: Payer: BLUE CROSS/BLUE SHIELD | Admitting: Family Medicine

## 2019-05-27 DIAGNOSIS — U071 COVID-19: Secondary | ICD-10-CM

## 2019-05-27 HISTORY — DX: COVID-19: U07.1

## 2019-09-18 ENCOUNTER — Ambulatory Visit: Payer: BLUE CROSS/BLUE SHIELD | Admitting: Family Medicine

## 2019-09-18 ENCOUNTER — Other Ambulatory Visit: Payer: Self-pay

## 2019-09-18 ENCOUNTER — Encounter: Payer: Self-pay | Admitting: Family Medicine

## 2019-09-18 VITALS — BP 124/77 | HR 79 | Temp 98.1°F | Resp 16 | Ht 62.0 in | Wt 123.4 lb

## 2019-09-18 DIAGNOSIS — E063 Autoimmune thyroiditis: Secondary | ICD-10-CM | POA: Diagnosis not present

## 2019-09-18 DIAGNOSIS — Z79890 Hormone replacement therapy (postmenopausal): Secondary | ICD-10-CM

## 2019-09-18 NOTE — Patient Instructions (Addendum)
Nice to re-meet you today.  Follow up yearly for physicals, sooner if needed   Please help [place] help you:  We are honored you have chosen [facility] for your Primary Care home. Below you will find basic instructions that you may need to access in the future. Please help [place] help you by reading the instructions, which cover many of the frequent questions we experience.   Prescription refills and request:  -In order to allow more efficient response time, please call your pharmacy for all refills. They will forward the request electronically to [place]. This allows for the quickest possible response. Request left on a nurse line can take longer to refill, since these are checked as time allows between office patients and other phone calls.  - refill request can take up to 3-5 working days to complete.  - If request is sent electronically and request is appropiate, it is usually completed in 1-2 business days.  - all patients will need to be seen routinely for all chronic medical conditions requiring prescription medications (see follow-up below). If you are overdue for follow up on your condition, you will be asked to make an appointment and we will call in enough medication to cover you until your appointment (up to 30 days).  - all controlled substances will require a face to face visit to request/refill.  - if you desire your prescriptions to go through a new pharmacy, and have an active script at original pharmacy, you will need to call your pharmacy and have scripts transferred to new pharmacy. This is completed between the pharmacy locations and not by your provider.    Results: Our office handles many outgoing and incoming calls daily. If we have not contacted you within 1 week about your results, please check your mychart to see if there is a message first and if not, then contact our office.  In helping with this matter, you help decrease call volume, and therefore allow [place] to be able to respond  to patients needs more efficiently.  We will always attempt to call you with results,  normal or abnormal. However, if we are unable to reach you we will send a message in your my chart with results.   Acute office visits (sick visit):  An acute visit is intended for a new problem and are scheduled in shorter time slots to allow schedule openings for patients with new problems. This is the appropriate visit to discuss a new problem. Problems will not be addressed by phone call or Echart message. Appointment is needed if requesting treatment. In order to provide you with excellent quality medical care with proper time for you to explain your problem, have an exam and receive treatment with instructions, these appointments should be limited to one new problem per visit. If you experience a new problem, in which you desire to be addressed, please make an acute office visit, we save openings on the schedule to accommodate you. Please do not save your new problem for any other type of visit, let us take care of it properly and quickly for you.   Follow up visits:  Depending on your condition(s) your provider will need to see you routinely in order to provide you with quality care and prescribe medication(s). Most chronic conditions (Example: hypertension, Diabetes, depression/anxiety... etc), require visits a couple times a year. Your provider will instruct you on proper follow up for your personal medical conditions and history. Please make certain to make follow up  appointments for your condition as instructed. Failing to do so could result in lapse in your medication treatment/refills. If you request a refill, and are overdue to be seen on a condition, we will always provide you with a 30 day script (once) to allow you time to schedule.    Medicare wellness (well visit): - we have a wonderful Nurse Maudie Mercury), that will meet with you and provide you will yearly medicare wellness visits. These visits should occur  yearly (can not be scheduled less than 1 calendar year apart) and cover preventive health, immunizations, advance directives and screenings you are entitled to yearly through your medicare benefits. Do not miss out on your entitled benefits, this is when medicare will pay for these benefits to be ordered for you.  These are strongly encouraged by your provider and is the appropriate type of visit to make certain you are up to date with all preventive health benefits. If you have not had your medicare wellness exam in the last 12 months, please make certain to schedule one by calling the office and schedule your medicare wellness with Maudie Mercury as soon as possible.   Yearly physical (well visit):  - Adults are recommended to be seen yearly for physicals. Check with your insurance and date of your last physical, most insurances require one calendar year between physicals. Physicals include all preventive health topics, screenings, medical exam and labs that are appropriate for gender/age and history. You may have fasting labs needed at this visit. This is a well visit (not a sick visit), new problems should not be covered during this visit (see acute visit).  - Pediatric patients are seen more frequently when they are younger. Your provider will advise you on well child visit timing that is appropriate for your their age. - This is not a medicare wellness visit. Medicare wellness exams do not have an exam portion to the visit. Some medicare companies allow for a physical, some do not allow a yearly physical. If your medicare allows a yearly physical you can schedule the medicare wellness with our nurse Maudie Mercury and have your physical with your provider after, on the same day. Please check with insurance for your full benefits.   Late Policy/No Shows:  - all new patients should arrive 15-30 minutes earlier than appointment to allow [place] time  to  obtain all personal demographics,  insurance information and for you to  complete office paperwork. - All established patients should arrive 10-15 minutes earlier than appointment time to update all information and be checked in .  - In our best efforts to run on time, if you are late for your appointment you will be asked to either reschedule or if able, we will work you back into the schedule. There will be a wait time to work you back in the schedule,  depending on availability.  - If you are unable to make it to your appointment as scheduled, please call 24 hours ahead of time to allow [place] to fill the time slot with someone else who needs to be seen. If you do not cancel your appointment ahead of time, you may be charged a no show fee.

## 2019-09-18 NOTE — Progress Notes (Signed)
Patient ID: Melissa Johnson, female  DOB: 14-Dec-1970, 49 y.o.   MRN: [id number] Patient Care Team    Relationship Specialty Notifications Start End  Ma Hillock, DO PCP - General Family Medicine  09/18/19   [facility]    09/18/19   Molli Posey, MD Consulting Physician Obstetrics and Gynecology  09/18/19   Nat Christen, MD Attending Physician Optometry  09/20/19     Chief Complaint  Patient presents with  . Establish Care    Pt does not need refills and does not need CPE. Another MD does do RX's for pt.     Subjective:  Melissa Johnson is a 49 y.o.  female present for TOC establishment. All past medical history, surgical history, allergies, family history, immunizations, medications and social history were updated in the electronic medical record today. All recent labs, ED visits and hospitalizations within the last year were reviewed.  Patient present for transfer of care from Dr. Colin Benton.  She has a history of Hashimoto's thyroiditis and is being managed by [facility] on NP thyroid 60 mg daily.  She also was prescribed progesterone.  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  She supplements of vitamin B12, vitamin C, vitamin D and takes a probiotic.   Depression screen [facility] 2/9 04/17/2018 04/14/2017  Decreased Interest 0 0  Down, Depressed, Hopeless 0 0  PHQ - 2 Score 0 0  Altered sleeping 0 -  Tired, decreased energy 3 -  Change in appetite 0 -  Feeling bad or failure about yourself  0 -  Trouble concentrating 0 -  Moving slowly or fidgety/restless 0 -  Suicidal thoughts 0 -  PHQ-9 Score 3 -   No flowsheet data found.   No flowsheet data found.  Immunization History  Administered Date(s) Administered  . Influenza,inj,Quad PF,6+ Mos 01/29/2014, 12/22/2014  . Td 04/17/2018  . Tdap 11/23/2007    No exam data present  Past Medical History:  Diagnosis Date  . Allergy     . Arthritis    OA, DDD  . Chicken pox   . COVID-19 05/2019  . History of UTI   . Hypothyroidism    Allergies  Allergen Reactions  . Levothyroxine     Rash/acne, hair loss   Past Surgical History:  Procedure Laterality Date  . NO PAST SURGERIES     Family History  Problem Relation Age of Onset  . Heart disease Father        CAD  . Hearing loss Father   . Hyperlipidemia Father   . Hypertension Father   . Heart disease Paternal Grandmother   . Arthritis Mother   . Cancer Mother        Vulva; poss breast also- had bx and tx  . Hearing loss Mother   . Thyroid disease Maternal Grandfather    Social History   Social History Narrative   Marital status/children/pets: Married.  2 children.   Education/employment: Bachelor's degree.  Works in [employer].   Safety:      -smoke alarm in the home:Yes     - wears seatbelt: Yes     - Feels safe in their relationships: Yes      Home Situation: lives with husband and two kids    Spiritual Beliefs: Christian                Allergies as of 09/18/2019  Reactions   Levothyroxine    Rash/acne, hair loss      Medication List       Accurate as of September 18, 2019 11:59 PM. If you have any questions, ask your nurse or doctor.        STOP taking these medications   clobetasol 0.05 % topical foam Commonly known as: OLUX Stopped by: Howard Pouch, DO   ivermectin 3 MG Tabs tablet Commonly known as: STROMECTOL Stopped by: Howard Pouch, DO   levothyroxine 50 MCG tablet Commonly known as: SYNTHROID Stopped by: Howard Pouch, DO     TAKE these medications   Align 4 MG Caps Take 1 capsule by mouth daily.   cholecalciferol 25 MCG (1000 UNIT) tablet Commonly known as: VITAMIN D3 Take 1,000 Units by mouth daily.   fexofenadine 180 MG tablet Commonly known as: ALLEGRA Take 180 mg by mouth daily.   fluticasone 50 MCG/ACT nasal spray Commonly known as: FLONASE 2 sprays each nostril daily for 1 month, then 1 spray each  nostril daily   NP Thyroid 30 MG tablet Generic drug: thyroid Take 60 mg by mouth daily. What changed: Another medication with the same name was removed. Continue taking this medication, and follow the directions you see here. Changed by: Howard Pouch, DO   progesterone 100 MG capsule Commonly known as: PROMETRIUM Take 100 mg by mouth daily.   QC TUMERIC COMPLEX PO Take by mouth.   valACYclovir 1000 MG tablet Commonly known as: VALTREX valacyclovir 1 gram tablet   VITAMIN B 12 PO Take by mouth.   vitamin C 1000 MG tablet Take 1,000 mg by mouth daily.       All past medical history, surgical history, allergies, family history, immunizations andmedications were updated in the EMR today and reviewed under the history and medication portions of their EMR.    No results found for this or any previous visit (from the past 2160 hour(s)).  ROS: 14 pt review of systems performed and negative (unless mentioned in an HPI)  Objective: BP 124/77 (BP Location: Right Arm, Patient Position: Sitting, Cuff Size: Normal)   Pulse 79   Temp 98.1 F (36.7 C) (Temporal)   Resp 16   Ht 5' 2"  (1.575 m)   Wt 123 lb 6 oz (56 kg)   LMP 09/08/2019 (Exact Date)   SpO2 99%   BMI 22.57 kg/m  Gen: Afebrile. No acute distress. Nontoxic in appearance, well-developed, well-nourished,  Pleasant female.  HENT: AT. [place].  Eyes:Pupils Equal Round Reactive to light, Extraocular movements intact,  Conjunctiva without redness, discharge or icterus. Neck/lymp/endocrine: Supple, no lymphadenopathy, no thyromegaly CV: RRR  Chest: CTAB, no wheeze, rhonchi or crackles. . Neuro/Msk:  Normal gait. PERLA. EOMi. Alert. Oriented x3.   Psych: Normal affect, dress and demeanor. Normal speech. Normal thought content and judgment.   Assessment/plan: Melissa Johnson is a 49 y.o. female present for  Hashimoto's thyroiditis/Hormone replacement therapy Currently being managed by [facility] with NP thyroid 60 mg  daily. Discussed treatment with levothyroxine versus Synthroid versus NP thyroid, since NP thyroid is recalled and getting more difficult to find. Continue management with [facility] Patient to follow-up yearly for CPE   No orders of the defined types were placed in this encounter.  No orders of the defined types were placed in this encounter.  Referral Orders  No referral(s) requested today     Note is dictated utilizing voice recognition software. Although note has been proof read prior to signing,  occasional typographical errors still can be missed. If any questions arise, please do not hesitate to call for verification.  Electronically signed by: Howard Pouch, DO [place]

## 2019-09-20 ENCOUNTER — Encounter: Payer: Self-pay | Admitting: Family Medicine

## 2019-09-20 DIAGNOSIS — Z79890 Hormone replacement therapy (postmenopausal): Secondary | ICD-10-CM | POA: Insufficient documentation

## 2020-09-02 ENCOUNTER — Other Ambulatory Visit: Payer: Self-pay | Admitting: Obstetrics and Gynecology

## 2020-09-02 DIAGNOSIS — N6002 Solitary cyst of left breast: Secondary | ICD-10-CM

## 2020-09-14 ENCOUNTER — Ambulatory Visit
Admission: RE | Admit: 2020-09-14 | Discharge: 2020-09-14 | Disposition: A | Discharge status: Home / Self Care | Payer: 59 | Source: Ambulatory Visit | Attending: Obstetrics and Gynecology | Admitting: Obstetrics and Gynecology

## 2020-09-14 ENCOUNTER — Other Ambulatory Visit: Payer: Self-pay

## 2020-09-14 DIAGNOSIS — N6002 Solitary cyst of left breast: Secondary | ICD-10-CM

## 2020-10-22 LAB — COLOGUARD: COLOGUARD: NEGATIVE

## 2020-10-22 LAB — EXTERNAL GENERIC LAB PROCEDURE: COLOGUARD: NEGATIVE

## 2021-08-18 DIAGNOSIS — M25511 Pain in right shoulder: Secondary | ICD-10-CM | POA: Insufficient documentation

## 2021-08-26 DIAGNOSIS — B001 Herpesviral vesicular dermatitis: Secondary | ICD-10-CM | POA: Insufficient documentation

## 2021-08-27 ENCOUNTER — Encounter: Payer: Self-pay | Admitting: Family Medicine

## 2021-08-27 ENCOUNTER — Ambulatory Visit (INDEPENDENT_AMBULATORY_CARE_PROVIDER_SITE_OTHER): Payer: BC Managed Care – PPO | Admitting: Family Medicine

## 2021-08-27 VITALS — BP 109/72 | HR 77 | Temp 98.3°F | Ht 62.0 in | Wt 116.0 lb

## 2021-08-27 DIAGNOSIS — Z111 Encounter for screening for respiratory tuberculosis: Secondary | ICD-10-CM

## 2021-08-27 NOTE — Progress Notes (Signed)
Melissa Johnson , 05-Mar-1971, 51 y.o., female MRN: [id number] Patient Care Team    Relationship Specialty Notifications Start End  Ma Hillock, DO PCP - General Family Medicine  09/18/19   [place]    09/18/19   Molli Posey, MD Consulting Physician Obstetrics and Gynecology  09/18/19   Nat Christen, MD Attending Physician Optometry  09/20/19     Chief Complaint  Patient presents with   Immunizations    TB test     Subjective: Pt presents for an OV to have tuberculosis screening completed for new employment. 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 She is looking forward to the career change.  She has not had TB/BCG vaccine, +TB screen in the past or known exposure to TB.      08/27/2021    9:40 AM 04/17/2018    9:06 AM 04/14/2017   12:58 PM  Depression screen PHQ 2/9  Decreased Interest 0 0 0  Down, Depressed, Hopeless 0 0 0  PHQ - 2 Score 0 0 0  Altered sleeping  0   Tired, decreased energy  3   Change in appetite  0   Feeling bad or failure about yourself   0   Trouble concentrating  0   Moving slowly or fidgety/restless  0   Suicidal thoughts  0   PHQ-9 Score  3     Allergies  Allergen Reactions   Levothyroxine Rash    Rash/acne, hair loss   Social History   Social History Narrative   Marital status/children/pets: Married.  2 children.   Education/employment: Bachelor's degree.  Works in [employer].   Safety:      -smoke alarm in the home:Yes     - wears seatbelt: Yes     - Feels safe in their relationships: Yes      Home Situation: lives with husband and two kids    Spiritual Beliefs: Christian               Past Medical History:  Diagnosis Date   Allergy    Arthritis    OA, DDD   Chicken pox    COVID-19 05/2019   History of UTI    Hypothyroidism    Past Surgical History:  Procedure Laterality Date   NO PAST SURGERIES     Family History  Problem Relation Age of Onset   Heart disease Father        CAD    Hearing loss Father    Hyperlipidemia Father    Hypertension Father    Heart disease Paternal Grandmother    Arthritis Mother    Cancer Mother        Vulva; poss breast also- had bx and tx   Hearing loss Mother    Thyroid disease Maternal Grandfather    Allergies as of 08/27/2021       Reactions   Levothyroxine Rash   Rash/acne, hair loss        Medication List        Accurate as of August 27, 2021  9:48 AM. If you have any questions, ask your nurse or doctor.          Align 4 MG Caps Take 1 capsule by mouth daily.   cholecalciferol 25 MCG (1000 UNIT) tablet Commonly known as: VITAMIN D3 Take 1,000 Units by mouth daily.   fexofenadine 180 MG tablet Commonly known as: ALLEGRA Take 180 mg by mouth daily.  fluticasone 50 MCG/ACT nasal spray Commonly known as: FLONASE 2 sprays each nostril daily for 1 month, then 1 spray each nostril daily   NP Thyroid 60 MG tablet Generic drug: thyroid Take 60 mg by mouth daily. What changed: Another medication with the same name was removed. Continue taking this medication, and follow the directions you see here. Changed by: Howard Pouch, DO   progesterone 100 MG capsule Commonly known as: PROMETRIUM Take 100 mg by mouth daily.   QC TUMERIC COMPLEX PO Take by mouth.   valACYclovir 1000 MG tablet Commonly known as: VALTREX valacyclovir 1 gram tablet   VITAMIN B 12 PO Take by mouth.   vitamin C 1000 MG tablet Take 1,000 mg by mouth daily.        All past medical history, surgical history, allergies, family history, immunizations andmedications were updated in the EMR today and reviewed under the history and medication portions of their EMR.     ROS Negative, with the exception of above mentioned in HPI   Objective:  BP 109/72   Pulse 77   Temp 98.3 F (36.8 C) (Oral)   Ht 5' 2"  (1.575 m)   Wt 116 lb (52.6 kg)   LMP 08/26/2021   SpO2 99%   BMI 21.22 kg/m  Body mass index is 21.22 kg/m. Physical  Exam Vitals and nursing note reviewed.  Constitutional:      General: She is not in acute distress.    Appearance: Normal appearance. She is normal weight. She is not ill-appearing or toxic-appearing.  HENT:     Head: Normocephalic and atraumatic.  Eyes:     Extraocular Movements: Extraocular movements intact.     Conjunctiva/sclera: Conjunctivae normal.     Pupils: Pupils are equal, round, and reactive to light.  Cardiovascular:     Rate and Rhythm: Normal rate and regular rhythm.  Pulmonary:     Effort: Pulmonary effort is normal.     Breath sounds: Normal breath sounds.  Neurological:     Mental Status: She is alert and oriented to person, place, and time. Mental status is at baseline.  Psychiatric:        Mood and Affect: Mood normal.        Behavior: Behavior normal.        Thought Content: Thought content normal.        Judgment: Judgment normal.     No results found. No results found. No results found for this or any previous visit (from the past 24 hour(s)).  Assessment/Plan: Melissa Johnson is a 51 y.o. female present for OV for  Tuberculosis screening Pt is starting a new job, requiring her to have screening. 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  She reports she does not need the hep series- it is not required.  - QuantiFERON-TB Gold Plus Pt will be called with results  Reviewed expectations re: course of current medical issues. Discussed self-management of symptoms. Outlined signs and symptoms indicating need for more acute intervention. Patient verbalized understanding and all questions were answered. Patient received an After-Visit Summary.    Orders Placed This Encounter  Procedures   QuantiFERON-TB Gold Plus   No orders of the defined types were placed in this encounter.  Referral Orders  No referral(s) requested today     Note is dictated utilizing voice recognition software. Although note has been proof read prior to signing,  occasional typographical errors still can be missed. If any questions arise, please  do not hesitate to call for verification.   electronically signed by:  Howard Pouch, DO  [place]

## 2021-08-30 ENCOUNTER — Telehealth: Payer: Self-pay

## 2021-08-30 LAB — QUANTIFERON-TB GOLD PLUS

## 2021-08-30 NOTE — Addendum Note (Signed)
Addended by: Octaviano Glow on: 08/30/2021 11:44 AM   Modules accepted: Orders

## 2021-08-30 NOTE — Telephone Encounter (Signed)
Pt called and will come today

## 2021-09-01 ENCOUNTER — Telehealth: Payer: Self-pay | Admitting: Family Medicine

## 2021-09-01 LAB — QUANTIFERON-TB GOLD PLUS
Mitogen-NIL: >10 IU/mL
NIL: 0.06 IU/mL
QuantiFERON-TB Gold Plus: NEGATIVE
TB1-NIL: <0 IU/mL
TB2-NIL: <0 IU/mL

## 2021-09-01 NOTE — Telephone Encounter (Signed)
Pt would like a call back concerning her most recent QuantiFERON-TB Gold Plus (Order [id number]) test from 08/30/21. Please advise pt at [phone].

## 2021-09-01 NOTE — Telephone Encounter (Signed)
Pt informed results aren't back yet and will receive notification from our office once completed.

## 2022-02-08 ENCOUNTER — Other Ambulatory Visit: Payer: Self-pay | Admitting: *Deleted

## 2022-02-08 DIAGNOSIS — E063 Autoimmune thyroiditis: Secondary | ICD-10-CM

## 2022-02-24 ENCOUNTER — Other Ambulatory Visit: Payer: BC Managed Care – PPO

## 2022-03-03 ENCOUNTER — Ambulatory Visit
Admission: RE | Admit: 2022-03-03 | Discharge: 2022-03-03 | Disposition: A | Discharge status: Home / Self Care | Payer: BC Managed Care – PPO | Source: Ambulatory Visit | Attending: *Deleted | Admitting: *Deleted

## 2022-03-03 DIAGNOSIS — E063 Autoimmune thyroiditis: Secondary | ICD-10-CM

## 2022-08-27 ENCOUNTER — Other Ambulatory Visit: Payer: Self-pay

## 2022-08-27 ENCOUNTER — Encounter (HOSPITAL_COMMUNITY): Payer: Self-pay | Admitting: *Deleted

## 2022-08-27 ENCOUNTER — Emergency Department (HOSPITAL_COMMUNITY)
Admission: EM | Admit: 2022-08-27 | Discharge: 2022-08-27 | Disposition: A | Discharge status: Home / Self Care | Payer: BC Managed Care – PPO | Attending: Emergency Medicine | Admitting: Emergency Medicine

## 2022-08-27 DIAGNOSIS — X58XXXA Exposure to other specified factors, initial encounter: Secondary | ICD-10-CM | POA: Insufficient documentation

## 2022-08-27 DIAGNOSIS — S0502XA Injury of conjunctiva and corneal abrasion without foreign body, left eye, initial encounter: Secondary | ICD-10-CM | POA: Diagnosis not present

## 2022-08-27 DIAGNOSIS — H109 Unspecified conjunctivitis: Secondary | ICD-10-CM | POA: Diagnosis not present

## 2022-08-27 DIAGNOSIS — H5712 Ocular pain, left eye: Secondary | ICD-10-CM

## 2022-08-27 MED ORDER — ERYTHROMYCIN 5 MG/GM OP OINT: TOPICAL_OINTMENT | OPHTHALMIC | 0 refills | Status: AC

## 2022-08-27 MED ORDER — ERYTHROMYCIN 5 MG/GM OP OINT
1.0000 | TOPICAL_OINTMENT | Freq: Once | OPHTHALMIC | Status: AC
  Administered 2022-08-27: 1 via OPHTHALMIC
  Filled 2022-08-27: qty 3.5

## 2022-08-27 MED ORDER — FLUORESCEIN SODIUM 1 MG OP STRP
1.0000 | ORAL_STRIP | Freq: Once | OPHTHALMIC | Status: AC
  Administered 2022-08-27: 1 via OPHTHALMIC
  Filled 2022-08-27: qty 1

## 2022-08-27 MED ORDER — VALACYCLOVIR HCL 500 MG PO TABS
1000.0000 mg | ORAL_TABLET | Freq: Every day | ORAL | Status: DC
  Administered 2022-08-27: 1000 mg via ORAL
  Filled 2022-08-27 (×2): qty 2

## 2022-08-27 MED ORDER — TETRACAINE HCL 0.5 % OP SOLN
2.0000 [drp] | Freq: Once | OPHTHALMIC | Status: AC
  Administered 2022-08-27: 2 [drp] via OPHTHALMIC
  Filled 2022-08-27: qty 4

## 2022-08-27 MED ORDER — VALACYCLOVIR HCL 1 G PO TABS: 1000.0000 mg | ORAL_TABLET | Freq: Three times a day (TID) | ORAL | 0 refills | Status: AC

## 2022-08-27 NOTE — Discharge Instructions (Addendum)
Your exam showed that you may have a small abrasion or cut to the surface of your left eye, for which I put you on an antibiotic ointment.  I also started you on Valtrex in case this may be an early herpes zoster or shingles outbreak.  I do not see any clear blistering around your eye at this point as a clear shingles outbreak.  I do recommend you follow-up with your own ophthalmologist this week for a more thorough eye exam, regardless.  If you have new or worsening loss of vision, worsening pain in your eye, please return to the emergency department.

## 2022-08-27 NOTE — ED Provider Notes (Signed)
[place] EMERGENCY DEPARTMENT AT [facility] Provider Note   CSN: [id number] Arrival date & time: 08/27/22  1542     History  Chief Complaint  Patient presents with   Eye Pain    Melissa Johnson is a 52 y.o. female who reports a history of shingles on her face presenting to ED with pain around her left eye and redness in her left eye.  Patient reports onset of symptoms several days ago.  She is concerned it may be shingles outbreak.  She has not noticed any of her typical blistering but her left periorbital region appears "puffy" and she says her eye is red and painful.  She denies that she has had herpes zoster ophthalmicus in the past, specifically, but she has had several shingles outbreaks in different parts of her body including her left face in the past.  HPI     Home Medications Prior to Admission medications   Medication Sig Start Date End Date Taking? Authorizing Provider  erythromycin ophthalmic ointment Place a 1/2 inch ribbon of ointment into the lower eyelid of the left eye four times daily for 5 days 08/27/22  Yes Demarie Uhlig, Kermit Balo, MD  valACYclovir (VALTREX) 1000 MG tablet Take 1 tablet (1,000 mg total) by mouth 3 (three) times daily for 7 days. 08/27/22 09/03/22 Yes Monetta Lick, Kermit Balo, MD  Cyanocobalamin (VITAMIN B 12 PO) Take by mouth.    [provider]  fexofenadine (ALLEGRA) 180 MG tablet Take 180 mg by mouth daily.    [provider]  NP THYROID 60 MG tablet Take 60 mg by mouth daily. 07/15/21   [provider]  Probiotic Product (ALIGN) 4 MG CAPS Take 1 capsule by mouth daily.    [provider]  progesterone (PROMETRIUM) 100 MG capsule Take 100 mg by mouth daily.    [provider]  Turmeric (QC TUMERIC COMPLEX PO) Take by mouth.    [provider]      Allergies    Levothyroxine    Review of Systems   Review of Systems  Physical Exam Updated Vital Signs BP (!) 152/83 (BP Location: Right Arm)    Pulse 74   Temp 99.4 F (37.4 C)   Resp 17   Ht 5\' 2"  (1.575 m)   Wt 52.6 kg   LMP 08/13/2022   SpO2 100%   BMI 21.21 kg/m  Physical Exam Constitutional:      General: She is not in acute distress. HENT:     Head: Normocephalic and atraumatic.  Eyes:     Comments: Left conjunctiva is injected, on fluorescein stain there is a region of uptake at approximately 2 o'clock position overlying the conjunctiva; no corneal ulceration evident, no dendritic lesions noted  Right ocular exam unremarkable  Cardiovascular:     Rate and Rhythm: Normal rate and regular rhythm.  Pulmonary:     Effort: Pulmonary effort is normal. No respiratory distress.  Skin:    General: Skin is warm and dry.     Comments: Very mild periorbital edema of the left eye  Neurological:     General: No focal deficit present.     Mental Status: She is alert and oriented to person, place, and time. Mental status is at baseline.  Psychiatric:        Mood and Affect: Mood normal.        Behavior: Behavior normal.     ED Results / Procedures / Treatments   Labs (all labs  ordered are listed, but only abnormal results are displayed) Labs Reviewed - No data to display  EKG None  Radiology No results found.  Procedures Procedures    Medications Ordered in ED Medications  valACYclovir (VALTREX) tablet 1,000 mg (has no administration in time range)  erythromycin ophthalmic ointment 1 Application (has no administration in time range)  fluorescein ophthalmic strip 1 strip (1 strip Both Eyes Given 08/27/22 1944)  tetracaine (PONTOCAINE) 0.5 % ophthalmic solution 2 drop (2 drops Both Eyes Given 08/27/22 1945)    ED Course/ Medical Decision Making/ A&P Clinical Course as of 08/27/22 2021  Sat Aug 27, 2022  2021 The patient reports that she has her own ophthalmologist that she follows up with. [MT]    Clinical Course User Index [MT] Kona Yusuf, Kermit Balo, MD                             Medical Decision  Making Risk Prescription drug management.   Patient is here with left eye pain and redness.  Clinically suspect is consistent with an abrasion versus conjunctivitis, which may be bacterial or viral, versus early herpes ophthalmicus.  I do not see any clear evidence of blistering zoster lesions, but this may be early in the course of infection, and I think is reasonable to start her on Valtrex.  I will also start her on erythromycin ointment as there is a region that looks consistent with an abrasion to the lateral aspect of her left conjunctiva , and she says she woke up with the symptoms, and may have therefore scratched her eye in her sleep.  There is no evidence of acute vision loss, I have low suspicion of acute angle-closure glaucoma, doubt retinal detachment or other acute ocular emergency.  She will follow-up with her ophthalmologist this week.  She is comfortable for discharge.  Valtrex and erythromycin ordered to be given his first dose here in the ED.        Final Clinical Impression(s) / ED Diagnoses Final diagnoses:  Left eye pain  Abrasion of left cornea, initial encounter  Conjunctivitis of left eye, unspecified conjunctivitis type    Rx / DC Orders ED Discharge Orders          Ordered    erythromycin ophthalmic ointment        08/27/22 2020    valACYclovir (VALTREX) 1000 MG tablet  3 times daily        08/27/22 2020              Terald Sleeper, MD 08/27/22 2021

## 2022-08-27 NOTE — ED Triage Notes (Signed)
Pt states L eye pain and redness/some blurred vision, for several days.  Hx of shingles to same eye.  Eye md stated to come here.

## 2022-08-29 ENCOUNTER — Telehealth: Payer: Self-pay

## 2022-08-29 NOTE — Telephone Encounter (Signed)
PCP was removed in error.

## 2023-05-13 IMAGING — MG DIGITAL DIAGNOSTIC BILAT W/ TOMO W/ CAD
6 of 10 series · 6 of 30 positions shown · non-contrast
Comparison: Previous exam(s).

CLINICAL DATA: The patient had a tender lump in the left breast
recently which has gotten smaller palpation. Tenderness remains.

EXAM:
DIGITAL DIAGNOSTIC BILATERAL MAMMOGRAM WITH TOMOSYNTHESIS AND CAD;
ULTRASOUND LEFT BREAST LIMITED
TECHNIQUE: Bilateral digital diagnostic mammography and breast tomosynthesis
was performed. The images were evaluated with computer-aided
detection.; Targeted ultrasound examination of the left breast was
performed

[L CC synth-2D (1 of 2)]
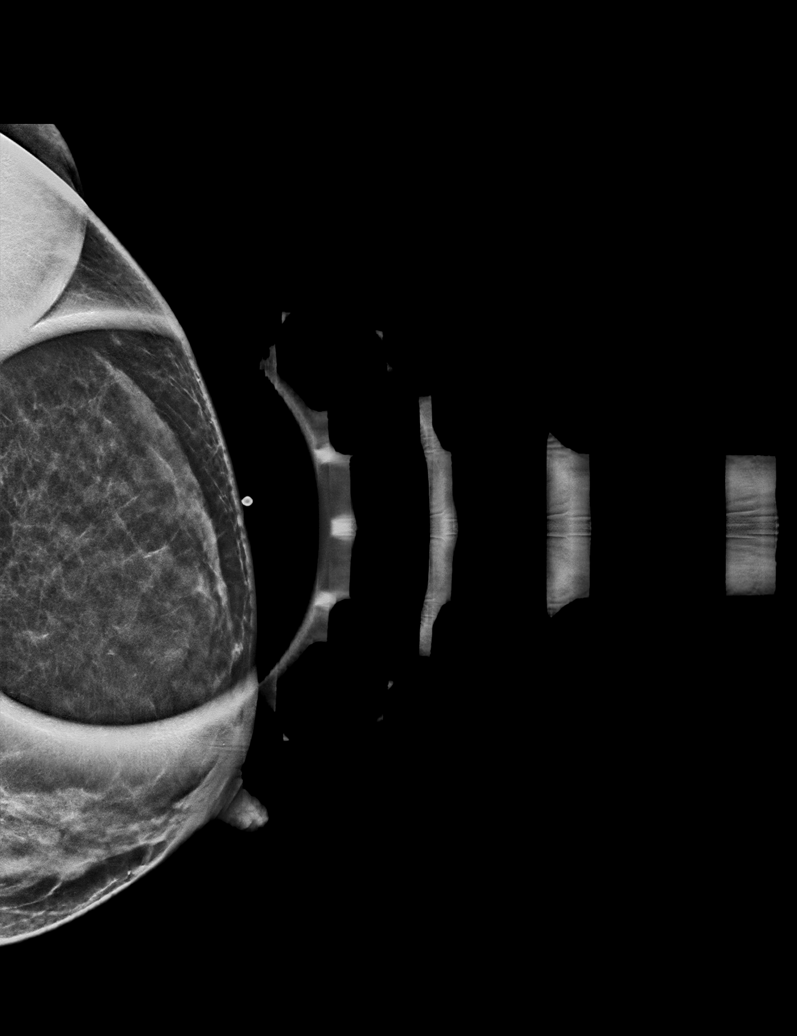

[R MLO synth-2D]
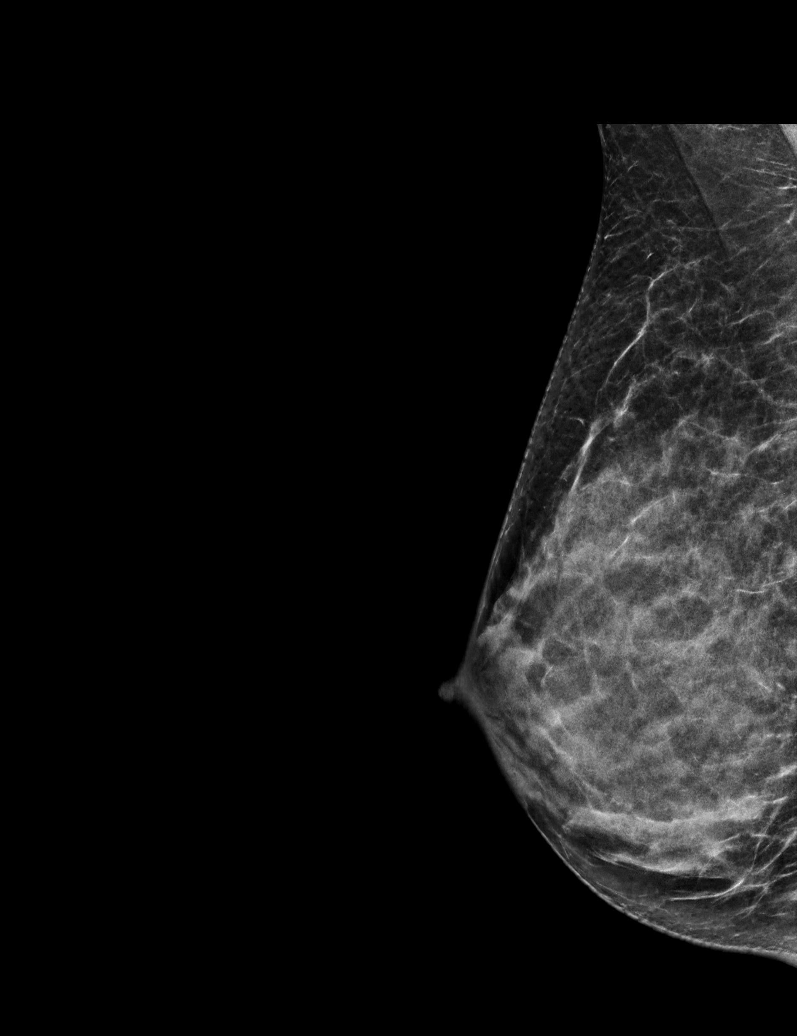

[L CC synth-2D (2 of 2)]
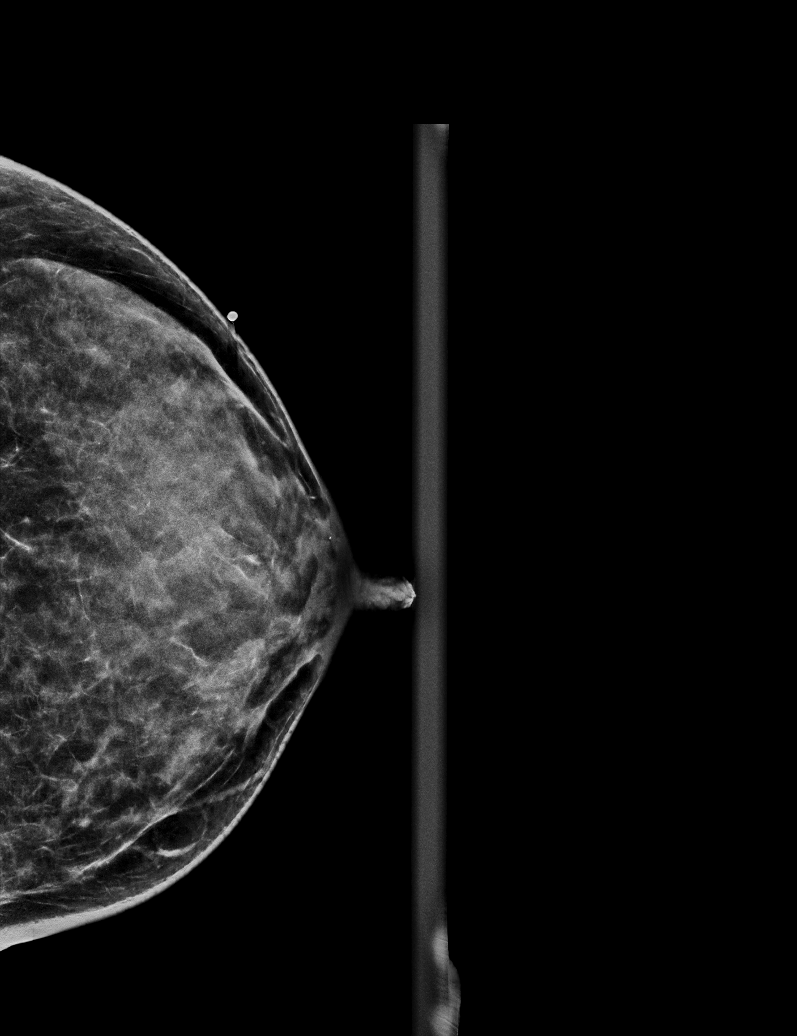

[R CC synth-2D]
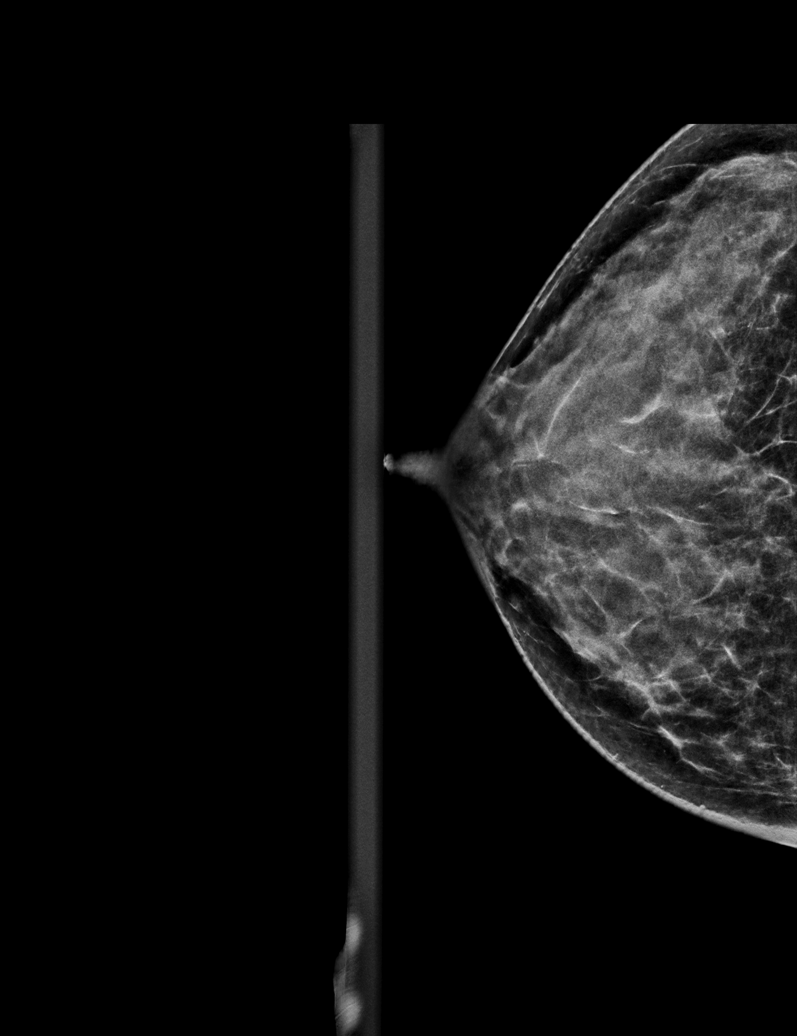

[L MLO synth-2D]
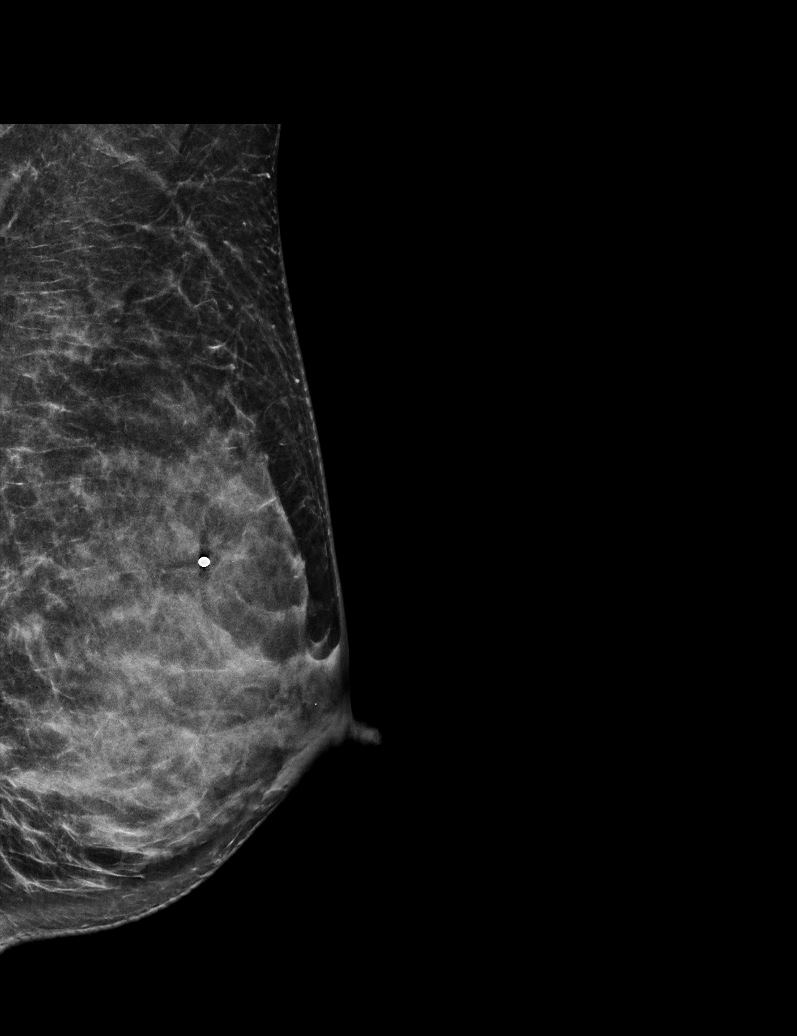

[R CC tomo · tomo slice 27/52.0]
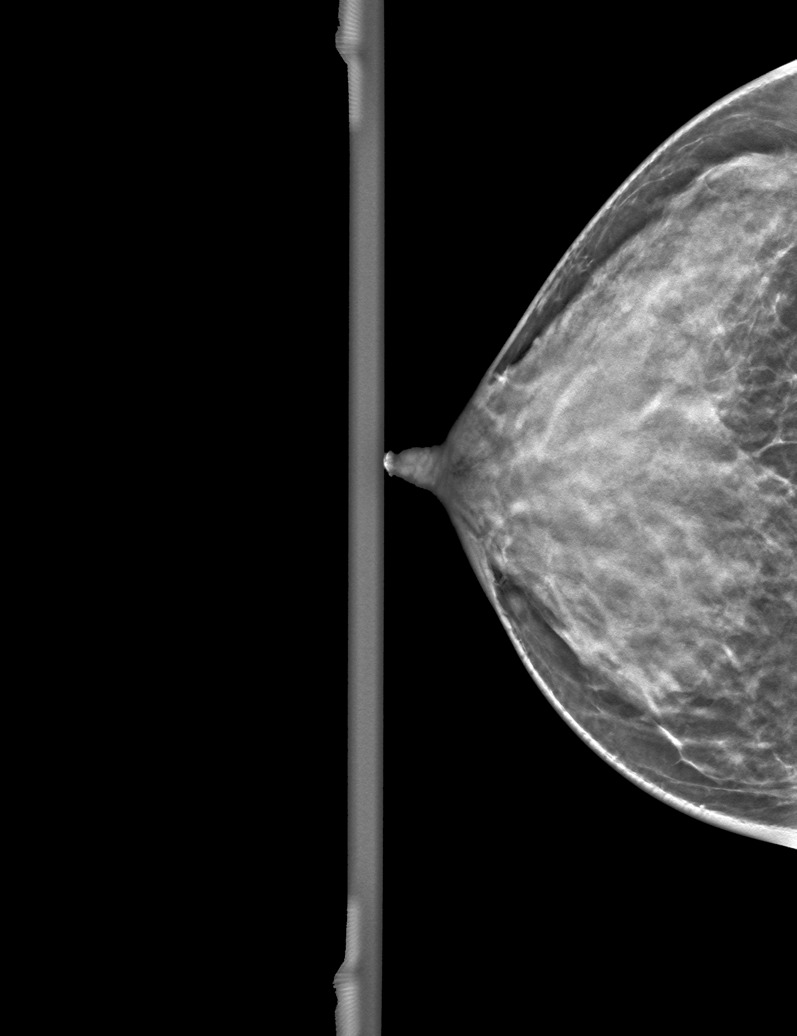

[6 of 30 positions shown; findings below may reference images not displayed]

ACR Breast Density Category c: The breast tissue is heterogeneously
dense, which may obscure small masses.
FINDINGS: There appear to be obscured masses in the upper outer left breast.
No discrete mass at site of the patient's palpable lump and
tenderness. No other evidence of malignancy in either breast.

On physical exam, no suspicious lumps are identified.

Targeted ultrasound is performed, showing numerous cysts throughout
the lateral left breast accounting for the patient's symptoms and
mammographic findings.
IMPRESSION: Fibrocystic changes.  No evidence of malignancy.

RECOMMENDATION:
Annual screening mammography.

I have discussed the findings and recommendations with the patient.
If applicable, a reminder letter will be sent to the patient
regarding the next appointment.

BI-RADS CATEGORY  2: Benign.

## 2023-05-13 IMAGING — US US BREAST*L* LIMITED INC AXILLA
1 series · 1 of 1 positions shown · non-contrast
Comparison: Previous exam(s).

CLINICAL DATA: The patient had a tender lump in the left breast
recently which has gotten smaller palpation. Tenderness remains.

EXAM:
DIGITAL DIAGNOSTIC BILATERAL MAMMOGRAM WITH TOMOSYNTHESIS AND CAD;
ULTRASOUND LEFT BREAST LIMITED
TECHNIQUE: Bilateral digital diagnostic mammography and breast tomosynthesis
was performed. The images were evaluated with computer-aided
detection.; Targeted ultrasound examination of the left breast was
performed

[Series 1: us breast*left* limited inc axilla · 0.07mm/px · 1 of 1 slices shown]
[im 1/1]
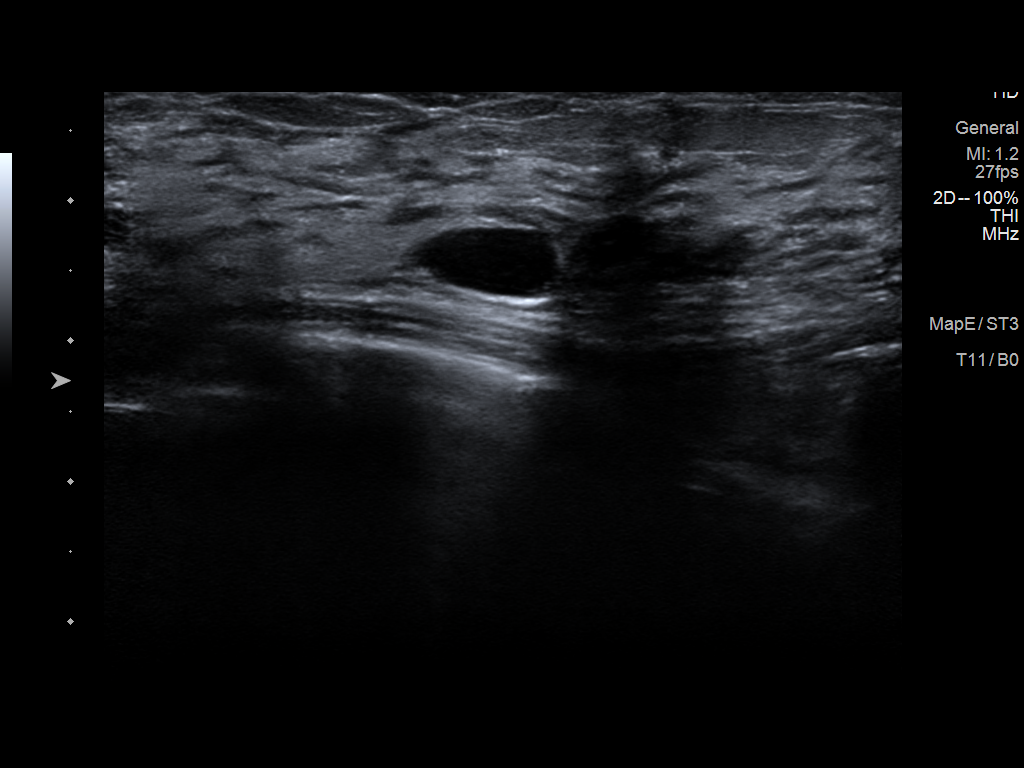

[1 of 1 positions shown; findings below may reference images not displayed]

ACR Breast Density Category c: The breast tissue is heterogeneously
dense, which may obscure small masses.
FINDINGS: There appear to be obscured masses in the upper outer left breast.
No discrete mass at site of the patient's palpable lump and
tenderness. No other evidence of malignancy in either breast.

On physical exam, no suspicious lumps are identified.

Targeted ultrasound is performed, showing numerous cysts throughout
the lateral left breast accounting for the patient's symptoms and
mammographic findings.
IMPRESSION: Fibrocystic changes.  No evidence of malignancy.

RECOMMENDATION:
Annual screening mammography.

I have discussed the findings and recommendations with the patient.
If applicable, a reminder letter will be sent to the patient
regarding the next appointment.

BI-RADS CATEGORY  2: Benign.

## 2024-03-26 ENCOUNTER — Other Ambulatory Visit: Payer: Self-pay

## 2024-03-26 ENCOUNTER — Other Ambulatory Visit: Payer: Self-pay | Admitting: *Deleted

## 2024-03-26 ENCOUNTER — Emergency Department (HOSPITAL_BASED_OUTPATIENT_CLINIC_OR_DEPARTMENT_OTHER)
Admission: EM | Admit: 2024-03-26 | Discharge: 2024-03-26 | Discharge status: Against Medical Advice | Attending: Emergency Medicine | Admitting: Emergency Medicine

## 2024-03-26 DIAGNOSIS — Z5321 Procedure and treatment not carried out due to patient leaving prior to being seen by health care provider: Secondary | ICD-10-CM | POA: Insufficient documentation

## 2024-03-26 DIAGNOSIS — H5319 Other subjective visual disturbances: Secondary | ICD-10-CM

## 2024-03-26 DIAGNOSIS — R519 Headache, unspecified: Secondary | ICD-10-CM | POA: Diagnosis not present

## 2024-03-26 DIAGNOSIS — H538 Other visual disturbances: Secondary | ICD-10-CM | POA: Insufficient documentation

## 2024-03-26 NOTE — ED Triage Notes (Signed)
 Patient reports vision change that began on 12/25 with headache. States vision has worsened since. Blurry vision. Denies double vision. Saw optometrist yesterday and was sent to ER.

## 2024-03-26 NOTE — ED Notes (Signed)
 Pt seen ambulating out of ER lobby prior to provider evaluation. No distress reported.
